# Patient Record
Sex: Female | Born: 1995 | Race: White | Hispanic: No | Smoking: Never smoker
Health system: Southern US, Community
[De-identification: ages and names within clinical notes are randomized; demographics above are authoritative.]

## PROBLEM LIST (undated history)

## (undated) ENCOUNTER — Inpatient Hospital Stay (HOSPITAL_COMMUNITY): Payer: Self-pay

## (undated) DIAGNOSIS — F32A Depression, unspecified: Secondary | ICD-10-CM

## (undated) DIAGNOSIS — N200 Calculus of kidney: Secondary | ICD-10-CM

## (undated) DIAGNOSIS — K219 Gastro-esophageal reflux disease without esophagitis: Secondary | ICD-10-CM

## (undated) DIAGNOSIS — F329 Major depressive disorder, single episode, unspecified: Secondary | ICD-10-CM

## (undated) DIAGNOSIS — F419 Anxiety disorder, unspecified: Secondary | ICD-10-CM

## (undated) DIAGNOSIS — Z87442 Personal history of urinary calculi: Secondary | ICD-10-CM

## (undated) DIAGNOSIS — I10 Essential (primary) hypertension: Secondary | ICD-10-CM

## (undated) HISTORY — DX: Depression, unspecified: F32.A

## (undated) HISTORY — DX: Major depressive disorder, single episode, unspecified: F32.9

---
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
## 2014-03-07 ENCOUNTER — Encounter (HOSPITAL_COMMUNITY): Payer: Self-pay | Admitting: Urology

## 2015-02-26 ENCOUNTER — Emergency Department (HOSPITAL_COMMUNITY)
Admission: EM | Admit: 2015-02-26 | Discharge: 2015-02-26 | Disposition: A | Discharge status: Home / Self Care | Payer: Self-pay | Attending: Emergency Medicine | Admitting: Emergency Medicine

## 2015-02-26 ENCOUNTER — Encounter (HOSPITAL_COMMUNITY): Payer: Self-pay | Admitting: Emergency Medicine

## 2015-02-26 DIAGNOSIS — Z79899 Other long term (current) drug therapy: Secondary | ICD-10-CM | POA: Insufficient documentation

## 2015-02-26 DIAGNOSIS — I10 Essential (primary) hypertension: Secondary | ICD-10-CM | POA: Insufficient documentation

## 2015-02-26 DIAGNOSIS — H578 Other specified disorders of eye and adnexa: Secondary | ICD-10-CM | POA: Insufficient documentation

## 2015-02-26 DIAGNOSIS — G43909 Migraine, unspecified, not intractable, without status migrainosus: Secondary | ICD-10-CM | POA: Insufficient documentation

## 2015-02-26 DIAGNOSIS — J309 Allergic rhinitis, unspecified: Secondary | ICD-10-CM | POA: Insufficient documentation

## 2015-02-26 DIAGNOSIS — F329 Major depressive disorder, single episode, unspecified: Secondary | ICD-10-CM | POA: Insufficient documentation

## 2015-02-26 DIAGNOSIS — Z79818 Long term (current) use of other agents affecting estrogen receptors and estrogen levels: Secondary | ICD-10-CM | POA: Insufficient documentation

## 2015-02-26 DIAGNOSIS — R1110 Vomiting, unspecified: Secondary | ICD-10-CM | POA: Insufficient documentation

## 2015-02-26 DIAGNOSIS — R197 Diarrhea, unspecified: Secondary | ICD-10-CM | POA: Insufficient documentation

## 2015-02-26 DIAGNOSIS — Z7951 Long term (current) use of inhaled steroids: Secondary | ICD-10-CM | POA: Insufficient documentation

## 2015-02-26 DIAGNOSIS — J069 Acute upper respiratory infection, unspecified: Secondary | ICD-10-CM | POA: Insufficient documentation

## 2015-02-26 LAB — RAPID STREP SCREEN (MED CTR MEBANE ONLY): Streptococcus, Group A Screen (Direct): NEGATIVE

## 2015-02-26 MED ORDER — NAPROXEN 250 MG PO TABS: 250.0000 mg | ORAL_TABLET | Freq: Two times a day (BID) | ORAL | Status: DC

## 2015-02-26 MED ORDER — CETIRIZINE HCL 10 MG PO TABS: 10.0000 mg | ORAL_TABLET | Freq: Every day | ORAL | Status: DC

## 2015-02-26 MED ORDER — FLUTICASONE PROPIONATE 50 MCG/ACT NA SUSP: 2.0000 | Freq: Every day | NASAL | Status: DC

## 2015-02-26 NOTE — Discharge Instructions (Signed)
Allergic Rhinitis °Allergic rhinitis is when the mucous membranes in the nose respond to allergens. Allergens are particles in the air that cause your body to have an allergic reaction. This causes you to release allergic antibodies. Through a chain of events, these eventually cause you to release histamine into the blood stream. Although meant to protect the body, it is this release of histamine that causes your discomfort, such as frequent sneezing, congestion, and an itchy, runny nose.  °CAUSES °Seasonal allergic rhinitis (hay fever) is caused by pollen allergens that may come from grasses, trees, and weeds. Year-round allergic rhinitis (perennial allergic rhinitis) is caused by allergens such as house dust mites, pet dander, and mold spores. °SYMPTOMS °· Nasal stuffiness (congestion). °· Itchy, runny nose with sneezing and tearing of the eyes. °DIAGNOSIS °Your health care provider can help you determine the allergen or allergens that trigger your symptoms. If you and your health care provider are unable to determine the allergen, skin or blood testing may be used. Your health care provider will diagnose your condition after taking your health history and performing a physical exam. Your health care provider may assess you for other related conditions, such as asthma, pink eye, or an ear infection. °TREATMENT °Allergic rhinitis does not have a cure, but it can be controlled by: °· Medicines that block allergy symptoms. These may include allergy shots, nasal sprays, and oral antihistamines. °· Avoiding the allergen. °Hay fever may often be treated with antihistamines in pill or nasal spray forms. Antihistamines block the effects of histamine. There are over-the-counter medicines that may help with nasal congestion and swelling around the eyes. Check with your health care provider before taking or giving this medicine. °If avoiding the allergen or the medicine prescribed do not work, there are many new medicines  your health care provider can prescribe. Stronger medicine may be used if initial measures are ineffective. Desensitizing injections can be used if medicine and avoidance does not work. Desensitization is when a patient is given ongoing shots until the body becomes less sensitive to the allergen. Make sure you follow up with your health care provider if problems continue. °HOME CARE INSTRUCTIONS °It is not possible to completely avoid allergens, but you can reduce your symptoms by taking steps to limit your exposure to them. It helps to know exactly what you are allergic to so that you can avoid your specific triggers. °SEEK MEDICAL CARE IF: °· You have a fever. °· You develop a cough that does not stop easily (persistent). °· You have shortness of breath. °· You start wheezing. °· Symptoms interfere with normal daily activities. °  °This information is not intended to replace advice given to you by your health care provider. Make sure you discuss any questions you have with your health care provider. °  °Document Released: 11/03/2000 Document Revised: 03/01/2014 Document Reviewed: 10/16/2012 °Elsevier Interactive Patient Education ©2016 [employer] °Upper Respiratory Infection, Adult °Most upper respiratory infections (URIs) are a viral infection of the air passages leading to the lungs. A URI affects the nose, throat, and upper air passages. The most common type of URI is nasopharyngitis and is typically referred to as "the common cold." °URIs run their course and usually go away on their own. Most of the time, a URI does not require medical attention, but sometimes a bacterial infection in the upper airways can follow a viral infection. This is called a secondary infection. Sinus and middle ear infections are common types of secondary upper respiratory infections. °  Bacterial pneumonia can also complicate a URI. A URI can worsen asthma and chronic obstructive pulmonary disease (COPD). Sometimes, these  complications can require emergency medical care and may be life threatening.  °CAUSES °Almost all URIs are caused by viruses. A virus is a type of germ and can spread from one person to another.  °RISKS FACTORS °You may be at risk for a URI if:  °· You smoke.   °· You have chronic heart or lung disease. °· You have a weakened defense (immune) system.   °· You are very young or very old.   °· You have nasal allergies or asthma. °· You work in crowded or poorly ventilated areas. °· You work in health care facilities or schools. °SIGNS AND SYMPTOMS  °Symptoms typically develop 2-3 days after you come in contact with a cold virus. Most viral URIs last 7-10 days. However, viral URIs from the influenza virus (flu virus) can last 14-18 days and are typically more severe. Symptoms may include:  °· Runny or stuffy (congested) nose.   °· Sneezing.   °· Cough.   °· Sore throat.   °· Headache.   °· Fatigue.   °· Fever.   °· Loss of appetite.   °· Pain in your forehead, behind your eyes, and over your cheekbones (sinus pain). °· Muscle aches.   °DIAGNOSIS  °Your health care provider may diagnose a URI by: °· Physical exam. °· Tests to check that your symptoms are not due to another condition such as: °¨ Strep throat. °¨ Sinusitis. °¨ Pneumonia. °¨ Asthma. °TREATMENT  °A URI goes away on its own with time. It cannot be cured with medicines, but medicines may be prescribed or recommended to relieve symptoms. Medicines may help: °· Reduce your fever. °· Reduce your cough. °· Relieve nasal congestion. °HOME CARE INSTRUCTIONS  °· Take medicines only as directed by your health care provider.   °· Gargle warm saltwater or take cough drops to comfort your throat as directed by your health care provider. °· Use a warm mist humidifier or inhale steam from a shower to increase air moisture. This may make it easier to breathe. °· Drink enough fluid to keep your urine clear or pale yellow.   °· Eat soups and other clear broths and maintain  good nutrition.   °· Rest as needed.   °· Return to work when your temperature has returned to normal or as your health care provider advises. You may need to stay home longer to avoid infecting others. You can also use a face mask and careful hand washing to prevent spread of the virus. °· Increase the usage of your inhaler if you have asthma.   °· Do not use any tobacco products, including cigarettes, chewing tobacco, or electronic cigarettes. If you need help quitting, ask your health care provider. °PREVENTION  °The best way to protect yourself from getting a cold is to practice good hygiene.  °· Avoid oral or hand contact with people with cold symptoms.   °· Wash your hands often if contact occurs.   °There is no clear evidence that vitamin C, vitamin E, echinacea, or exercise reduces the chance of developing a cold. However, it is always recommended to get plenty of rest, exercise, and practice good nutrition.  °SEEK MEDICAL CARE IF:  °· You are getting worse rather than better.   °· Your symptoms are not controlled by medicine.   °· You have chills. °· You have worsening shortness of breath. °· You have brown or red mucus. °· You have yellow or brown nasal discharge. °· You have pain in your   face, especially when you bend forward. °· You have a fever. °· You have swollen neck glands. °· You have pain while swallowing. °· You have white areas in the back of your throat. °SEEK IMMEDIATE MEDICAL CARE IF:  °· You have severe or persistent: °¨ Headache. °¨ Ear pain. °¨ Sinus pain. °¨ Chest pain. °· You have chronic lung disease and any of the following: °¨ Wheezing. °¨ Prolonged cough. °¨ Coughing up blood. °¨ A change in your usual mucus. °· You have a stiff neck. °· You have changes in your: °¨ Vision. °¨ Hearing. °¨ Thinking. °¨ Mood. °MAKE SURE YOU:  °· Understand these instructions. °· Will watch your condition. °· Will get help right away if you are not doing well or get worse. °  °This information is not  intended to replace advice given to you by your health care provider. Make sure you discuss any questions you have with your health care provider. °  °Document Released: 08/04/2000 Document Revised: 06/25/2014 Document Reviewed: 05/16/2013 °Elsevier Interactive Patient Education ©2016 Elsevier Inc. ° °

## 2015-02-26 NOTE — ED Provider Notes (Signed)
CSN: [id number]     Arrival date & time 02/26/15  1037 History  By signing my name below, I, Tanda RockersMargaux Venter, attest that this documentation has been prepared under the direction and in the presence of Everlene FarrierWilliam Tylin Stradley, PA-C. Electronically Signed: Tanda RockersMargaux Venter, ED Scribe. 02/26/2015. 11:53 AM.   Chief Complaint  Patient presents with  . Sore Throat  . Nasal Congestion   The history is provided by the patient. No language interpreter was used.     HPI Comments: Holly Jacobs is a 20 y.o. female who presents to the Emergency Department complaining of gradual onset, constant, sore throat x 3 days. Pt also complains of nasal congestion, post nasal drip, bilateral ear pressure, sneezing, myalgias, and a mild cough. She reports vomiting and diarrhea earlier in the week but none today Pt has been eating and drinking normally today. Denies wheezing, shortness of breath, lightheadedness, dizziness, syncope, nausea, fever, abdominal pain, or any other associated symptoms.   Past Medical History  Diagnosis Date  . Migraine   . Hypertension   . Depression    Past Surgical History  Procedure Laterality Date  . Cystoscopy with retrograde pyelogram, ureteroscopy and stent placement Left 12/29/2012    Procedure: CYSTOSCOPY WITH LEFT RETROGRADE PYELOGRAM, BALLOON DILATION LEFT URETER; LEFT URETEROSCOPY ;  Surgeon: Ky BarbanMohammad I Javaid, MD;  Location: [facility];  Service: Urology;  Laterality: Left;  . Cystoscopy with stent placement Right 12/29/2012    Procedure: CYSTOSCOPY WITH STENT PLACEMENT RIGHT URETER;  Surgeon: Ky BarbanMohammad I Javaid, MD;  Location: [facility];  Service: Urology;  Laterality: Right;  . Cystoscopy w/ ureteral stent removal Right 12/30/2012    Procedure: CYSTOSCOPY WITH STENT REMOVAL;  Surgeon: Ky BarbanMohammad I Javaid, MD;  Location: [facility];  Service: Urology;  Laterality: Right;   Family History  Problem Relation Age of Onset  . Migraines Father   . Skin cancer Maternal Grandmother   . Diabetes  Maternal Grandmother   . High Cholesterol Maternal Grandmother    Social History  Substance Use Topics  . Smoking status: Never Smoker   . Smokeless tobacco: Never Used  . Alcohol Use: No   OB History    No data available     Review of Systems  Constitutional: Negative for fever and appetite change.  HENT: Positive for congestion, ear pain, postnasal drip, rhinorrhea, sneezing and sore throat. Negative for ear discharge, facial swelling, mouth sores, nosebleeds, sinus pressure and trouble swallowing.   Eyes: Positive for itching. Negative for pain, discharge and redness.  Respiratory: Positive for cough. Negative for shortness of breath and wheezing.   Gastrointestinal: Negative for nausea, vomiting, abdominal pain and diarrhea.  Genitourinary: Negative for dysuria.  Musculoskeletal: Positive for myalgias.  Skin: Negative for rash.  Neurological: Negative for dizziness, syncope and light-headedness.      Allergies  Imitrex; Sulfa antibiotics; and Tramadol  Home Medications   Prior to Admission medications   Medication Sig Start Date End Date Taking? Authorizing Provider  atomoxetine (STRATTERA) 18 MG capsule Take 18 mg by mouth daily.    Historical Provider, MD  cetirizine (ZYRTEC ALLERGY) 10 MG tablet Take 1 tablet (10 mg total) by mouth daily. 02/26/15   Everlene FarrierWilliam Karis Emig, PA-C  cyclobenzaprine (FLEXERIL) 10 MG tablet Take 10 mg by mouth at bedtime.    Historical Provider, MD  fluticasone (FLONASE) 50 MCG/ACT nasal spray Place 2 sprays into both nostrils daily. 02/26/15   Everlene FarrierWilliam Aldwin Micalizzi, PA-C  levonorgestrel-ethinyl estradiol (AVIANE,ALESSE,LESSINA) 0.1-20 MG-MCG tablet Take 1 tablet by  mouth daily.    Historical Provider, MD  naproxen (NAPROSYN) 250 MG tablet Take 1 tablet (250 mg total) by mouth 2 (two) times daily with a meal. 02/26/15   Everlene Farrier, PA-C  oxyCODONE-acetaminophen (PERCOCET) 7.5-325 MG per tablet Take 1 tablet by mouth every 6 (six) hours as needed for pain.  12/29/12   Robyne Askew, MD  topiramate (TOPAMAX) 25 MG capsule Take 50 mg by mouth every evening.     Historical Provider, MD   Triage Vitals: BP 140/77 mmHg  Pulse 91  Temp(Src) 97.9 F (36.6 C) (Oral)  Resp 16  Ht 5\' 5"  (1.651 m)  Wt 151 lb (68.493 kg)  BMI 25.13 kg/m2  SpO2 100%   Physical Exam  Constitutional: She appears well-developed and well-nourished. No distress.  Nontoxic appearing.  HENT:  Head: Normocephalic and atraumatic.  Right Ear: External ear normal.  Left Ear: External ear normal.  Mouth/Throat: No oropharyngeal exudate.  Mild middle ear effusion bilaterally No TM erythema or loss of landmarks Bogy nasal turbinates bilaterally Mild bilateral tonsillar hypertrophy without exudates Uvula is midline without edema  No peritonsillar abscess No trismus  Eyes: Conjunctivae are normal. Pupils are equal, round, and reactive to light. Right eye exhibits no discharge. Left eye exhibits no discharge.  Neck: Normal range of motion. Neck supple. No JVD present. No tracheal deviation present.  Cardiovascular: Normal rate, regular rhythm, normal heart sounds and intact distal pulses.   Pulmonary/Chest: Effort normal and breath sounds normal. No respiratory distress. She has no wheezes. She has no rhonchi. She has no rales.  Lungs are clear to auscultation bilaterally.  Abdominal: Soft. There is no tenderness. There is no guarding.  Lymphadenopathy:    She has no cervical adenopathy.  Neurological: She is alert. Coordination normal.  Skin: Skin is warm and dry. No rash noted. She is not diaphoretic. No erythema. No pallor.  Psychiatric: She has a normal mood and affect. Her behavior is normal.  Nursing note and vitals reviewed.   ED Course  Procedures (including critical care time)  DIAGNOSTIC STUDIES: Oxygen Saturation is 100% on RA, normal by my interpretation.    COORDINATION OF CARE: 11:51 AM-Discussed treatment plan which includes nasal steroids with pt at  bedside and pt agreed to plan.   Labs Review Labs Reviewed  RAPID STREP SCREEN (NOT AT [facility])  CULTURE, GROUP A STREP    Imaging Review No results found. I have personally reviewed and evaluated these lab results as part of my medical decision-making.   EKG Interpretation None      Filed Vitals:   02/26/15 1049  BP: 140/77  Pulse: 91  Temp: 97.9 F (36.6 C)  TempSrc: Oral  Resp: 16  Height: 5\' 5"  (1.651 m)  Weight: 68.493 kg  SpO2: 100%     MDM   Meds given in ED:  Medications - No data to display  Discharge Medication List as of 02/26/2015 11:54 AM    START taking these medications   Details  cetirizine (ZYRTEC ALLERGY) 10 MG tablet Take 1 tablet (10 mg total) by mouth daily., Starting 02/26/2015, Until Discontinued, Print    fluticasone (FLONASE) 50 MCG/ACT nasal spray Place 2 sprays into both nostrils daily., Starting 02/26/2015, Until Discontinued, Print    naproxen (NAPROSYN) 250 MG tablet Take 1 tablet (250 mg total) by mouth 2 (two) times daily with a meal., Starting 02/26/2015, Until Discontinued, Print        Final diagnoses:  URI (upper respiratory infection)  Allergic  rhinitis, unspecified allergic rhinitis type   This s a 20 y.o. female who presents to the Emergency Department complaining of gradual onset, constant, sore throat x 3 days. Pt also complains of nasal congestion, post nasal drip, bilateral ear pressure, sneezing, myalgias, and a mild cough. She reports she has been eating and drinking normally. On exam the patient is afebrile nontoxic appearing. Patient has mild bilateral tonsillar hypertrophy without exudates. Uvula is midline. No peritonsillar abscess. No trismus. Mild middle ear effusion. No evidence of TM erythema or infection. Lungs are clear to auscultation bilaterally. Rapid strep is negative. Patient has symptoms of allergic rhinitis and upper respiratory infection. We will discharge patient with persistent for Zyrtec, Flonase and naproxen.  I encouraged her to push fluids. Encourage close follow-up by her primary care provider. I advised the patient to follow-up with their primary care provider this week. I advised the patient to return to the emergency department with new or worsening symptoms or new concerns. The patient verbalized understanding and agreement with plan.     I personally performed the services described in this documentation, which was scribed in my presence. The recorded information has been reviewed and is accurate.        Everlene Farrier, PA-C 02/26/15 1206  Leta Baptist, MD 03/01/15 [phone]

## 2015-02-26 NOTE — ED Notes (Signed)
Patient states sore throat since Monday.   Patient also complains of nasal congestion, but no chest congestion.   States having some generalized body aches.   Patient has been taking advil and dayquil/nyquil at home.

## 2015-02-28 LAB — CULTURE, GROUP A STREP: STREP A CULTURE: NEGATIVE

## 2018-09-25 ENCOUNTER — Inpatient Hospital Stay (HOSPITAL_COMMUNITY): Payer: Medicaid Other

## 2018-09-25 ENCOUNTER — Other Ambulatory Visit: Payer: Self-pay

## 2018-09-25 ENCOUNTER — Inpatient Hospital Stay (HOSPITAL_COMMUNITY)
Admission: EM | Admit: 2018-09-25 | Discharge: 2018-09-25 | Disposition: A | Discharge status: Home / Self Care | Payer: Medicaid Other | Attending: Obstetrics and Gynecology | Admitting: Obstetrics and Gynecology

## 2018-09-25 ENCOUNTER — Encounter (HOSPITAL_COMMUNITY): Payer: Self-pay | Admitting: Student

## 2018-09-25 DIAGNOSIS — Z3A01 Less than 8 weeks gestation of pregnancy: Secondary | ICD-10-CM | POA: Insufficient documentation

## 2018-09-25 DIAGNOSIS — O200 Threatened abortion: Secondary | ICD-10-CM

## 2018-09-25 DIAGNOSIS — R1031 Right lower quadrant pain: Secondary | ICD-10-CM | POA: Diagnosis present

## 2018-09-25 DIAGNOSIS — O26899 Other specified pregnancy related conditions, unspecified trimester: Secondary | ICD-10-CM

## 2018-09-25 LAB — COMPREHENSIVE METABOLIC PANEL
ALT: 19 U/L (ref 0–44)
AST: 21 U/L (ref 15–41)
Albumin: 4.1 g/dL (ref 3.5–5.0)
Alkaline Phosphatase: 61 U/L (ref 38–126)
Anion gap: 9 (ref 5–15)
BUN: 9 mg/dL (ref 6–20)
CO2: 23 mmol/L (ref 22–32)
Calcium: 9.4 mg/dL (ref 8.9–10.3)
Chloride: 106 mmol/L (ref 98–111)
Creatinine, Ser: 0.71 mg/dL (ref 0.44–1.00)
GFR calc Af Amer: >60 mL/min (ref >60)
GFR calc non Af Amer: >60 mL/min (ref >60)
Glucose, Bld: 92 mg/dL (ref 70–99)
Potassium: 3.5 mmol/L (ref 3.5–5.1)
Sodium: 138 mmol/L (ref 135–145)
Total Bilirubin: 0.6 mg/dL (ref 0.3–1.2)
Total Protein: 6.8 g/dL (ref 6.5–8.1)

## 2018-09-25 LAB — CBC WITH DIFFERENTIAL/PLATELET
Abs Immature Granulocytes: 0.02 10*3/uL (ref 0.00–0.07)
Basophils Absolute: 0 10*3/uL (ref 0.0–0.1)
Basophils Relative: 1 %
Eosinophils Absolute: 0 10*3/uL (ref 0.0–0.5)
Eosinophils Relative: 0 %
HCT: 39 % (ref 36.0–46.0)
Hemoglobin: 13.3 g/dL (ref 12.0–15.0)
Immature Granulocytes: 0 %
Lymphocytes Relative: 27 %
Lymphs Abs: 2 10*3/uL (ref 0.7–4.0)
MCH: 31.7 pg (ref 26.0–34.0)
MCHC: 34.1 g/dL (ref 30.0–36.0)
MCV: 93.1 fL (ref 80.0–100.0)
Monocytes Absolute: 0.9 10*3/uL (ref 0.1–1.0)
Monocytes Relative: 13 %
Neutro Abs: 4.4 10*3/uL (ref 1.7–7.7)
Neutrophils Relative %: 59 %
Platelets: 291 10*3/uL (ref 150–400)
RBC: 4.19 MIL/uL (ref 3.87–5.11)
RDW: 11.9 % (ref 11.5–15.5)
WBC: 7.4 10*3/uL (ref 4.0–10.5)
nRBC: 0 % (ref 0.0–0.2)

## 2018-09-25 LAB — WET PREP, GENITAL
Clue Cells Wet Prep HPF POC: NONE SEEN
Sperm: NONE SEEN
Trich, Wet Prep: NONE SEEN
Yeast Wet Prep HPF POC: NONE SEEN

## 2018-09-25 LAB — POCT PREGNANCY, URINE: Preg Test, Ur: POSITIVE — AB

## 2018-09-25 LAB — HCG, QUANTITATIVE, PREGNANCY: hCG, Beta Chain, Quant, S: 2691 m[IU]/mL — ABNORMAL HIGH (ref <5)

## 2018-09-25 NOTE — ED Triage Notes (Signed)
Called report and transportation.

## 2018-09-25 NOTE — MAU Note (Signed)
Been having some abd pain in her stomach this past wk.  Kind of felt like it did when she had ovarian cyst.  Realized she was late on her period, HPT was positive. Denies bleeding or d/c.

## 2018-09-25 NOTE — Discharge Instructions (Signed)

## 2018-09-25 NOTE — MAU Provider Note (Signed)
Chief Complaint: Abdominal Pain and Possible Pregnancy   First Provider Initiated Contact with Patient 09/25/18 1327     SUBJECTIVE HPI: Holly Jacobs is a 23 y.o. G1P0 at [redacted]w[redacted]d who presents to MAU reporting RLQ abdominal pain since last week; "like when I had an ovarian cyst." She realized her period was late and took a (+) HPT on 09/23/18. She denies VB or abnormal vaginal d/c.  Location: RLQ Quality: dull Severity: 3-4/10 on pain scale Duration: 1 week Timing: intermittent Modifying factors: nothing Associated signs and symptoms: amenorrhea  Past Medical History:  Diagnosis Date  . Depression   . Hypertension   . Migraine    OB History  Gravida Para Term Preterm AB Living  1            SAB TAB Ectopic Multiple Live Births               # Outcome Date GA Lbr Len/2nd Weight Sex Delivery Anes PTL Lv  1 Current            Past Surgical History:  Procedure Laterality Date  . CYSTOSCOPY W/ URETERAL STENT REMOVAL Right 12/30/2012   Procedure: CYSTOSCOPY WITH STENT REMOVAL;  Surgeon: Marissa Nestle, MD;  Location: [facility];  Service: Urology;  Laterality: Right;  . CYSTOSCOPY WITH RETROGRADE PYELOGRAM, URETEROSCOPY AND STENT PLACEMENT Left 12/29/2012   Procedure: CYSTOSCOPY WITH LEFT RETROGRADE PYELOGRAM, BALLOON DILATION LEFT URETER; LEFT URETEROSCOPY ;  Surgeon: Marissa Nestle, MD;  Location: [facility];  Service: Urology;  Laterality: Left;  . CYSTOSCOPY WITH STENT PLACEMENT Right 12/29/2012   Procedure: CYSTOSCOPY WITH STENT PLACEMENT RIGHT URETER;  Surgeon: Marissa Nestle, MD;  Location: [facility];  Service: Urology;  Laterality: Right;   Social History   Socioeconomic History  . Marital status: Single    Spouse name: Not on file  . Number of children: 0  . Years of education: HS  . Highest education level: Not on file  Occupational History    Employer: [employer]     Comment: [employer]    Employer: OTHER    Comment: Student  Social Needs  . Financial  resource strain: Not on file  . Food insecurity    Worry: Not on file    Inability: Not on file  . Transportation needs    Medical: Not on file    Non-medical: Not on file  Tobacco Use  . Smoking status: Never Smoker  . Smokeless tobacco: Never Used  Substance and Sexual Activity  . Alcohol use: No  . Drug use: No  . Sexual activity: Yes    Birth control/protection: Pill  Lifestyle  . Physical activity    Days per week: Not on file    Minutes per session: Not on file  . Stress: Not on file  Relationships  . Social Herbalist on phone: Not on file    Gets together: Not on file    Attends religious service: Not on file    Active member of club or organization: Not on file    Attends meetings of clubs or organizations: Not on file    Relationship status: Not on file  . Intimate partner violence    Fear of current or ex partner: Not on file    Emotionally abused: Not on file    Physically abused: Not on file    Forced sexual activity: Not on file  Other Topics Concern  . Not on  file  Social History Narrative   Patient lives at home with mom, grandma, step-grandpa.   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    Caffeine Use: 1 soda daily occasionally   Family History  Problem Relation Age of Onset  . Migraines Father   . Skin cancer Maternal Grandmother   . Diabetes Maternal Grandmother   . High Cholesterol Maternal Grandmother    No current facility-administered medications on file prior to encounter.    Current Outpatient Medications on File Prior to Encounter  Medication Sig Dispense Refill  . atomoxetine (STRATTERA) 18 MG capsule Take 18 mg by mouth daily.    . cetirizine (ZYRTEC ALLERGY) 10 MG tablet Take 1 tablet (10 mg total) by mouth daily. 30 tablet 1  . cyclobenzaprine (FLEXERIL) 10 MG tablet Take 10 mg by mouth at bedtime.    . fluticasone (FLONASE) 50 MCG/ACT nasal spray Place 2 sprays into both nostrils daily. 16 g 0  .  levonorgestrel-ethinyl estradiol (AVIANE,ALESSE,LESSINA) 0.1-20 MG-MCG tablet Take 1 tablet by mouth daily.    . naproxen (NAPROSYN) 250 MG tablet Take 1 tablet (250 mg total) by mouth 2 (two) times daily with a meal. 30 tablet 0  . oxyCODONE-acetaminophen (PERCOCET) 7.5-325 MG per tablet Take 1 tablet by mouth every 6 (six) hours as needed for pain. 30 tablet 0  . topiramate (TOPAMAX) 25 MG capsule Take 50 mg by mouth every evening.      Allergies  Allergen Reactions  . Imitrex [Sumatriptan Base] Anaphylaxis, Shortness Of Breath and Itching  . Sulfa Antibiotics Anaphylaxis  . Tramadol Hives    I have reviewed patient's Past Medical Hx, Surgical Hx, Family Hx, Social Hx, medications and allergies.   Review of Systems  Constitutional: Negative.   HENT: Negative.   Eyes: Negative.   Respiratory: Negative.   Cardiovascular: Negative.   Gastrointestinal: Negative.   Endocrine: Negative.   Genitourinary: Positive for pelvic pain (RT side pain, "like when I had an ovarian cyst last year").       Missed period, (+) HPT on 09/23/18  Musculoskeletal: Negative.   Skin: Negative.   Allergic/Immunologic: Negative.   Neurological: Negative.   Hematological: Negative.   Psychiatric/Behavioral: Negative.     OBJECTIVE Patient Vitals for the past 24 hrs:  BP Temp Temp src Pulse Resp SpO2 Height Weight  09/25/18 1135 129/79 98.5 F (36.9 C) Oral 71 18 100 % 5\' 5"  (1.651 m) 76.4 kg   Constitutional: Well-developed, well-nourished female in no acute distress.  Cardiovascular: normal rate & rhythm, no murmur Respiratory: normal rate and effort. Lung sounds clear throughout GI: Abd soft, non-tender, Pos BS x 4. No guarding or rebound tenderness MS: Extremities nontender, no edema, normal ROM Neurologic: Alert and oriented x 4.  GU:     SPECULUM EXAM: NEFG, physiologic discharge, no blood noted, cervix clean  BIMANUAL: No CMT. cervix pink, no lesions; uterus normal size, mild RT adnexal  tenderness, no masses.    LAB RESULTS Results for orders placed or performed during the hospital encounter of 09/25/18 (from the past 24 hour(s))  Pregnancy, urine POC     Status: Abnormal   Collection Time: 09/25/18 11:05 AM  Result Value Ref Range   Preg Test, Ur POSITIVE (A) NEGATIVE  CBC with Differential/Platelet     Status: None   Collection Time: 09/25/18 11:46 AM  Result Value Ref Range   WBC 7.4 4.0 - 10.5 K/uL   RBC 4.19 3.87 - 5.11 MIL/uL  Hemoglobin 13.3 12.0 - 15.0 g/dL   HCT 16.139.0 09.636.0 - 04.546.0 %   MCV 93.1 80.0 - 100.0 fL   MCH 31.7 26.0 - 34.0 pg   MCHC 34.1 30.0 - 36.0 g/dL   RDW 40.911.9 81.111.5 - 91.415.5 %   Platelets 291 150 - 400 K/uL   nRBC 0.0 0.0 - 0.2 %   Neutrophils Relative % 59 %   Neutro Abs 4.4 1.7 - 7.7 K/uL   Lymphocytes Relative 27 %   Lymphs Abs 2.0 0.7 - 4.0 K/uL   Monocytes Relative 13 %   Monocytes Absolute 0.9 0.1 - 1.0 K/uL   Eosinophils Relative 0 %   Eosinophils Absolute 0.0 0.0 - 0.5 K/uL   Basophils Relative 1 %   Basophils Absolute 0.0 0.0 - 0.1 K/uL   Immature Granulocytes 0 %   Abs Immature Granulocytes 0.02 0.00 - 0.07 K/uL  Comprehensive metabolic panel     Status: None   Collection Time: 09/25/18 11:46 AM  Result Value Ref Range   Sodium 138 135 - 145 mmol/L   Potassium 3.5 3.5 - 5.1 mmol/L   Chloride 106 98 - 111 mmol/L   CO2 23 22 - 32 mmol/L   Glucose, Bld 92 70 - 99 mg/dL   BUN 9 6 - 20 mg/dL   Creatinine, Ser 7.820.71 0.44 - 1.00 mg/dL   Calcium 9.4 8.9 - 95.610.3 mg/dL   Total Protein 6.8 6.5 - 8.1 g/dL   Albumin 4.1 3.5 - 5.0 g/dL   AST 21 15 - 41 U/L   ALT 19 0 - 44 U/L   Alkaline Phosphatase 61 38 - 126 U/L   Total Bilirubin 0.6 0.3 - 1.2 mg/dL   GFR calc non Af Amer >60 >60 mL/min   GFR calc Af Amer >60 >60 mL/min   Anion gap 9 5 - 15  hCG, quantitative, pregnancy     Status: Abnormal   Collection Time: 09/25/18 11:46 AM  Result Value Ref Range   hCG, Beta Chain, Quant, S 2,691 (H) <5 mIU/mL  ABO/Rh     Status: None    Collection Time: 09/25/18 11:48 AM  Result Value Ref Range   ABO/RH(D)      A NEG Performed at [facility], [address] [place], [place] [address]   Wet prep, genital     Status: Abnormal   Collection Time: 09/25/18  1:36 PM  Result Value Ref Range   Yeast Wet Prep HPF POC NONE SEEN NONE SEEN   Trich, Wet Prep NONE SEEN NONE SEEN   Clue Cells Wet Prep HPF POC NONE SEEN NONE SEEN   WBC, Wet Prep HPF POC FEW (A) NONE SEEN   Sperm NONE SEEN     IMAGING [place] Ob Less Than 14 Weeks With Ob Transvaginal  Result Date: 09/25/2018 CLINICAL DATA:  Pelvic pain EXAM: OBSTETRIC <14 WK US AND TRANSVAGINAL OB US TECHNIQUE: Both transabdominal and transvaginal ultrasound examinations were performed for complete evaluation of the gestation as well as the maternal uterus, adnexal regions, and pelvic cul-de-sac. Transvaginal technique was performed to assess early pregnancy. COMPARISON:  None. FINDINGS: Intrauterine gestational sac: Visualized Yolk sac:  Not visualized Embryo:  Not visualized Cardiac Activity: Not visualized MSD: 3 mm   5 w   0 d Subchorionic hemorrhage:  None visualized. Maternal uterus/adnexae: Cervical os closed. Right ovary measures 2.1 x 1.4 x 1.0 cm. Left ovary measures 3.9 x 2.5 x 2.8 cm. There is an approximately 2  cm corpus luteum on the right. No free pelvic fluid. IMPRESSION: Probable early intrauterine gestational sac, but no yolk sac, fetal pole, or cardiac activity yet visualized. Recommend follow-up quantitative B-HCG levels and follow-up US in 14 days to assess viability. This recommendation follows SRU consensus guidelines: Diagnostic Criteria for Nonviable Pregnancy Early in the First Trimester. Malva Limes Engl J Med 2013; 161:0960-45; 369:1443-51. based on gestational sac size, estimated gestational age is approximately 5 weeks. Study otherwise unremarkable. Electronically Signed   By: Bretta BangWilliam  Woodruff III M.D.   On: 09/25/2018 14:41    MAU COURSE Orders Placed This Encounter  Procedures   . Wet prep, genital  . US OB LESS THAN 14 WEEKS WITH OB TRANSVAGINAL  . Urinalysis, Routine w reflex microscopic  . CBC with Differential/Platelet  . Comprehensive metabolic panel  . hCG, quantitative, pregnancy  . Pregnancy, urine POC  . ABO/Rh  . Discharge patient    MDM  ASSESSMENT 1. Threatened miscarriage in early pregnancy   2. Abdominal pain in pregnancy     PLAN Discharge home in stable condition. Information provided on abd pain in pg and threatened miscarriage Bleeding and miscarriage precautions reviewed Return to MAU:   If you have heavier bleeding that soaks through more that 2 pads per hour for an hour or more  If you bleed so much that you feel like you might pass out or you do pass out  If you have significant abdominal pain that is not improved with Tylenol   If you develop a fever > 100.5  Follow-up Information    [facility] Go on 09/27/2018.   Specialty: Obstetrics and Gynecology Why: At 1:30 PM for repeat blood work Contact information: [address], [id number] [place] [phone]         Allergies as of 09/25/2018      Reactions   Imitrex [sumatriptan Base] Anaphylaxis, Shortness Of Breath, Itching   Sulfa Antibiotics Anaphylaxis   Tramadol Hives      Medication List    STOP taking these medications   atomoxetine 18 MG capsule Commonly known as: STRATTERA   levonorgestrel-ethinyl estradiol 0.1-20 MG-MCG tablet Commonly known as: ALESSE   naproxen 250 MG tablet Commonly known as: Naprosyn   oxyCODONE-acetaminophen 7.5-325 MG tablet Commonly known as: Percocet   topiramate 25 MG capsule Commonly known as: TOPAMAX     TAKE these medications   cetirizine 10 MG tablet Commonly known as: ZyrTEC Allergy Take 1 tablet (10 mg total) by mouth daily.   cyclobenzaprine 10 MG tablet Commonly known as: FLEXERIL Take 10 mg by mouth at  bedtime.   fluticasone 50 MCG/ACT nasal spray Commonly known as: FLONASE Place 2 sprays into both nostrils daily.       Raelyn MoraDawson, Archer Moist, CNM 09/25/2018 1:27 PM

## 2018-09-26 LAB — ABO/RH
ABO/RH(D): A NEG
Antibody Screen: NEGATIVE

## 2018-09-26 LAB — GC/CHLAMYDIA PROBE AMP (~~LOC~~) NOT AT ARMC
Chlamydia: NEGATIVE
Neisseria Gonorrhea: NEGATIVE

## 2018-09-27 ENCOUNTER — Encounter: Payer: Self-pay | Admitting: Obstetrics and Gynecology

## 2018-09-27 ENCOUNTER — Other Ambulatory Visit: Payer: Self-pay

## 2018-09-27 ENCOUNTER — Telehealth: Payer: Self-pay | Admitting: General Practice

## 2018-09-27 ENCOUNTER — Ambulatory Visit (INDEPENDENT_AMBULATORY_CARE_PROVIDER_SITE_OTHER): Payer: Self-pay | Admitting: General Practice

## 2018-09-27 DIAGNOSIS — Z6791 Unspecified blood type, Rh negative: Secondary | ICD-10-CM | POA: Insufficient documentation

## 2018-09-27 DIAGNOSIS — O3680X0 Pregnancy with inconclusive fetal viability, not applicable or unspecified: Secondary | ICD-10-CM

## 2018-09-27 LAB — BETA HCG QUANT (REF LAB): hCG Quant: 4881 m[IU]/mL

## 2018-09-27 NOTE — Progress Notes (Signed)
Patient presents to office today for stat bhcg following recent MAU visit on 8/3. Patient reports improvement in pain since then and thinks what she maybe had then was gas- denies bleeding. Discussed with patient we are monitoring your bhcg levels today, results/history will be reviewed withd a doctor in the office and we will call her with results/updated plan of care. Patient verbalized understanding and had no questions at this time.  Reviewed results with Dr Hulan Fray who finds appropriate rise in bhcg levels- patient should have follow up ultrasound in 2 weeks. Scheduled 8/19 @ 845. Will call patient with results.  Koren Bound RN BSN 09/27/18

## 2018-09-27 NOTE — Telephone Encounter (Signed)
Called patient & informed her of bhcg results and ultrasound appt on 8/19 @ 845. Ectopic precautions reviewed with patient. Patient verbalized understanding & asked about bringing someone with her to her ultrasound appt. Told patient unfortunately visitors are still not allowed. Patient verbalized understanding & had no other questions.

## 2018-10-02 NOTE — Progress Notes (Signed)
I have reviewed this chart and agree with the RN/CMA assessment and management.    Tiberius Loftus C Keilen Kahl, MD, FACOG Attending Physician, Faculty Practice [facility]  

## 2018-10-06 ENCOUNTER — Telehealth: Payer: Self-pay

## 2018-10-06 MED ORDER — PROMETHAZINE HCL 25 MG PO TABS: 25.0000 mg | ORAL_TABLET | Freq: Four times a day (QID) | ORAL | 0 refills | Status: DC | PRN

## 2018-10-06 NOTE — Telephone Encounter (Signed)
Pt called and stated that she is having severe nausea could someone give her a call back.  Called pt and pt reports continuing to have nausea.  I e=porescribed per protocol Phenergan 25 mg po tablet and informed pt that if it makes her drowsy that she can try to take half the tablet.  I also advised pt that she can take Vit B6 100 mg po bid with Unisom 25 mg tablet at night.  Pt verbalized understanding.

## 2018-10-10 ENCOUNTER — Telehealth: Payer: Self-pay | Admitting: Family Medicine

## 2018-10-10 NOTE — Telephone Encounter (Signed)
Spoke to patient about her appointment on 8/19 @ 10:00. Patient instructed to wear a face mask for the entire appointment and no visitors are allowed with her during the visit. Patient screened for covid symptoms and denied having any

## 2018-10-11 ENCOUNTER — Other Ambulatory Visit: Payer: Self-pay

## 2018-10-11 ENCOUNTER — Encounter: Payer: Self-pay | Admitting: Medical

## 2018-10-11 ENCOUNTER — Ambulatory Visit (HOSPITAL_COMMUNITY)
Admission: RE | Admit: 2018-10-11 | Discharge: 2018-10-11 | Disposition: A | Discharge status: Home / Self Care | Payer: Medicaid Other | Source: Ambulatory Visit | Attending: Obstetrics & Gynecology | Admitting: Obstetrics & Gynecology

## 2018-10-11 ENCOUNTER — Ambulatory Visit (INDEPENDENT_AMBULATORY_CARE_PROVIDER_SITE_OTHER): Payer: Medicaid Other

## 2018-10-11 ENCOUNTER — Telehealth: Payer: Self-pay | Admitting: Family Medicine

## 2018-10-11 DIAGNOSIS — O3680X0 Pregnancy with inconclusive fetal viability, not applicable or unspecified: Secondary | ICD-10-CM | POA: Diagnosis not present

## 2018-10-11 DIAGNOSIS — Z712 Person consulting for explanation of examination or test findings: Secondary | ICD-10-CM

## 2018-10-11 NOTE — Telephone Encounter (Signed)
Spoke with patient about needing to change her appointment.

## 2018-10-11 NOTE — Progress Notes (Signed)
Pt here today for pregnancy [place] results.  Notified Dr. Hulan Fray pt's results.  Pt notified that she has simple cyst that will not cause any harm to her or baby.  Pt informed that she has a viable pregnancy with FHR 144 bpm, EDD 05/30/2019, and 7w 0d today.  Medications reconciled.  [place] office to provide proof of pregnancy letter to start prenatal care.   Mel Almond, RN 10/08/18

## 2018-10-12 NOTE — Progress Notes (Signed)
I have reviewed this chart and agree with the RN/CMA assessment and management.    Alp Goldwater C Jaymee Tilson, MD, FACOG Attending Physician, [facility]   

## 2018-10-31 ENCOUNTER — Telehealth: Payer: Medicaid Other

## 2018-11-02 ENCOUNTER — Telehealth: Payer: Medicaid Other

## 2018-11-13 ENCOUNTER — Telehealth: Payer: Self-pay | Admitting: Family Medicine

## 2018-11-13 NOTE — Telephone Encounter (Signed)
Called the patient to confirm the upcoming appointment. Received the message the mailbox is full and cannot receive messages at this time. Good-bye. °

## 2018-11-14 ENCOUNTER — Telehealth: Payer: Self-pay | Admitting: Obstetrics and Gynecology

## 2018-11-14 ENCOUNTER — Encounter: Payer: Medicaid Other | Admitting: Obstetrics and Gynecology

## 2018-11-14 NOTE — Telephone Encounter (Signed)
Called the patient to follow up on missed appointment. The patient stated she started care with someone else.

## 2019-02-23 NOTE — L&D Delivery Note (Signed)
Operative Delivery Note At 4:13 PM a viable female was delivered via Vaginal, Vacuum Investment banker, operational).  Presentation: vertex; Position: Left,, Occiput,, Anterior; Station: +2.  Verbal consent: obtained from patient.  Risks and benefits discussed in detail.  Risks include, but are not limited to the risks of anesthesia, bleeding, infection, damage to maternal tissues, fetal cephalhematoma.  There is also the risk of inability to effect vaginal delivery of the head, or shoulder dystocia that cannot be resolved by established maneuvers, leading to the need for emergency cesarean section.  Vacuum assistance offered due to FHR losing variability.  Mushroom cup used with 2 ctx, no pop-offs, green zone  APGAR: 8, 9; weight pending.   Placenta status: spontaneous, intact.   Cord:  with the following complications: nuchal x1, delivered through.   Anesthesia:  Epidural Instruments: Mushroom cup Episiotomy: None Lacerations: 1st degree;Vaginal;Labial Suture Repair: 3.0 vicryl rapide Est. Blood Loss (mL):  300  2 small vulvar skin tags at 2 and 3:00 removed sharply at pt request  Mom to postpartum.  Baby to Couplet care / Skin to Skin.  Holly Jacobs 05/01/2019, 4:46 PM

## 2019-03-30 ENCOUNTER — Other Ambulatory Visit: Payer: Self-pay

## 2019-03-30 ENCOUNTER — Encounter (HOSPITAL_COMMUNITY): Payer: Self-pay | Admitting: Obstetrics and Gynecology

## 2019-03-30 ENCOUNTER — Inpatient Hospital Stay (HOSPITAL_COMMUNITY)
Admission: AD | Admit: 2019-03-30 | Discharge: 2019-03-30 | Disposition: A | Discharge status: Home / Self Care | Payer: Medicaid Other | Attending: Obstetrics and Gynecology | Admitting: Obstetrics and Gynecology

## 2019-03-30 DIAGNOSIS — Y9301 Activity, walking, marching and hiking: Secondary | ICD-10-CM | POA: Insufficient documentation

## 2019-03-30 DIAGNOSIS — O26893 Other specified pregnancy related conditions, third trimester: Secondary | ICD-10-CM | POA: Diagnosis present

## 2019-03-30 DIAGNOSIS — O163 Unspecified maternal hypertension, third trimester: Secondary | ICD-10-CM | POA: Insufficient documentation

## 2019-03-30 DIAGNOSIS — Z6791 Unspecified blood type, Rh negative: Secondary | ICD-10-CM | POA: Diagnosis not present

## 2019-03-30 DIAGNOSIS — Z3689 Encounter for other specified antenatal screening: Secondary | ICD-10-CM | POA: Diagnosis not present

## 2019-03-30 DIAGNOSIS — O10913 Unspecified pre-existing hypertension complicating pregnancy, third trimester: Secondary | ICD-10-CM | POA: Diagnosis not present

## 2019-03-30 DIAGNOSIS — W19XXXA Unspecified fall, initial encounter: Secondary | ICD-10-CM

## 2019-03-30 DIAGNOSIS — Z79899 Other long term (current) drug therapy: Secondary | ICD-10-CM | POA: Insufficient documentation

## 2019-03-30 DIAGNOSIS — M25562 Pain in left knee: Secondary | ICD-10-CM | POA: Insufficient documentation

## 2019-03-30 DIAGNOSIS — Z3A31 31 weeks gestation of pregnancy: Secondary | ICD-10-CM | POA: Diagnosis not present

## 2019-03-30 LAB — URINALYSIS, ROUTINE W REFLEX MICROSCOPIC
Bilirubin Urine: NEGATIVE
Glucose, UA: NEGATIVE mg/dL
Hgb urine dipstick: NEGATIVE
Ketones, ur: NEGATIVE mg/dL
Nitrite: NEGATIVE
Protein, ur: NEGATIVE mg/dL
Specific Gravity, Urine: 1.019 (ref 1.005–1.030)
pH: 6 (ref 5.0–8.0)

## 2019-03-30 LAB — COMPREHENSIVE METABOLIC PANEL
ALT: 14 U/L (ref 0–44)
AST: 22 U/L (ref 15–41)
Albumin: 2.7 g/dL — ABNORMAL LOW (ref 3.5–5.0)
Alkaline Phosphatase: 83 U/L (ref 38–126)
Anion gap: 11 (ref 5–15)
BUN: 7 mg/dL (ref 6–20)
CO2: 22 mmol/L (ref 22–32)
Calcium: 8.9 mg/dL (ref 8.9–10.3)
Chloride: 105 mmol/L (ref 98–111)
Creatinine, Ser: 0.58 mg/dL (ref 0.44–1.00)
GFR calc Af Amer: >60 mL/min (ref >60)
GFR calc non Af Amer: >60 mL/min (ref >60)
Glucose, Bld: 92 mg/dL (ref 70–99)
Potassium: 3.6 mmol/L (ref 3.5–5.1)
Sodium: 138 mmol/L (ref 135–145)
Total Bilirubin: 0.1 mg/dL — ABNORMAL LOW (ref 0.3–1.2)
Total Protein: 5.7 g/dL — ABNORMAL LOW (ref 6.5–8.1)

## 2019-03-30 LAB — PROTEIN / CREATININE RATIO, URINE
Creatinine, Urine: 124.69 mg/dL
Protein Creatinine Ratio: 0.12 mg/mg{Cre} (ref 0.00–0.15)
Total Protein, Urine: 15 mg/dL

## 2019-03-30 LAB — CBC
HCT: 34.2 % — ABNORMAL LOW (ref 36.0–46.0)
Hemoglobin: 11.4 g/dL — ABNORMAL LOW (ref 12.0–15.0)
MCH: 29.9 pg (ref 26.0–34.0)
MCHC: 33.3 g/dL (ref 30.0–36.0)
MCV: 89.8 fL (ref 80.0–100.0)
Platelets: 297 10*3/uL (ref 150–400)
RBC: 3.81 MIL/uL — ABNORMAL LOW (ref 3.87–5.11)
RDW: 12.5 % (ref 11.5–15.5)
WBC: 10.7 10*3/uL — ABNORMAL HIGH (ref 4.0–10.5)
nRBC: 0 % (ref 0.0–0.2)

## 2019-03-30 NOTE — MAU Provider Note (Addendum)
History     CSN: [id number]  Arrival date and time: 03/30/19 1212   First Provider Initiated Contact with Patient 03/30/19 1325      Chief Complaint  Patient presents with  . Fall   Holly Jacobs is a 24 y.o. G1P0 at [redacted]w[redacted]d who presents today after a fall. She states that at 0830 today she fell going down a ramp from her building. She landed on her right hip and left knee. She did not have a direct hit to the abdomen. She She denies any contractions, VB or  LOF since the fall. She reports that the baby has been moving since the fall, but it seems slightly less than normal. She reports back pain and knee pain. She reports that she been having back pain and thinks it is unrelated to the fall. She rates her knee pain 3/10 at this time. She took tylenol for the pain earlier, but that did not help with the pain. Next OB visit 04/10/2019  Fall The accident occurred 3 to 6 hours ago. The fall occurred while walking. She fell from a height of 3 to 5 ft. She landed on concrete. There was no blood loss. The point of impact was the left knee and right hip. The pain is present in the left knee. The pain is at a severity of 3/10. Pertinent negatives include no fever. She has tried acetaminophen for the symptoms. The treatment provided no relief.    OB History    Gravida  1   Para      Term      Preterm      AB      Living        SAB      TAB      Ectopic      Multiple      Live Births              Past Medical History:  Diagnosis Date  . Depression   . Hypertension   . Migraine     Past Surgical History:  Procedure Laterality Date  . CYSTOSCOPY W/ URETERAL STENT REMOVAL Right 12/30/2012   Procedure: CYSTOSCOPY WITH STENT REMOVAL;  Surgeon: Ky Barban, MD;  Location: [facility];  Service: Urology;  Laterality: Right;  . CYSTOSCOPY WITH RETROGRADE PYELOGRAM, URETEROSCOPY AND STENT PLACEMENT Left 12/29/2012   Procedure: CYSTOSCOPY WITH LEFT RETROGRADE PYELOGRAM,  BALLOON DILATION LEFT URETER; LEFT URETEROSCOPY ;  Surgeon: Ky Barban, MD;  Location: [facility];  Service: Urology;  Laterality: Left;  . CYSTOSCOPY WITH STENT PLACEMENT Right 12/29/2012   Procedure: CYSTOSCOPY WITH STENT PLACEMENT RIGHT URETER;  Surgeon: Ky Barban, MD;  Location: [facility];  Service: Urology;  Laterality: Right;    Family History  Problem Relation Age of Onset  . Migraines Father   . Skin cancer Maternal Grandmother   . Diabetes Maternal Grandmother   . High Cholesterol Maternal Grandmother     Social History   Tobacco Use  . Smoking status: Never Smoker  . Smokeless tobacco: Never Used  Substance Use Topics  . Alcohol use: No  . Drug use: No    Allergies:  Allergies  Allergen Reactions  . Imitrex [Sumatriptan Base] Anaphylaxis, Shortness Of Breath and Itching  . Sulfa Antibiotics Anaphylaxis  . Latex Hives  . Tramadol Hives    Medications Prior to Admission  Medication Sig Dispense Refill Last Dose  . Prenatal Vit-Fe Fumarate-FA (PREPLUS) 27-1 MG TABS Take 1 tablet  by mouth daily.     . promethazine (PHENERGAN) 25 MG tablet Take 1 tablet (25 mg total) by mouth every 6 (six) hours as needed for nausea or vomiting. 30 tablet 0     Review of Systems  Constitutional: Negative for chills and fever.  Genitourinary: Negative for pelvic pain, vaginal bleeding and vaginal discharge.  Neurological: Negative for dizziness.   Physical Exam   Blood pressure 140/76, pulse 94, temperature 98.6 F (37 C), temperature source Oral, resp. rate 18, height 5\' 5"  (1.651 m), weight 93 kg, last menstrual period 08/23/2018, SpO2 99 %.  Physical Exam  Nursing note and vitals reviewed. Constitutional: She is oriented to person, place, and time. She appears well-developed and well-nourished. No distress.  HENT:  Head: Normocephalic.  Cardiovascular: Normal rate.  Respiratory: Effort normal.  GI: Soft. There is no abdominal tenderness. There is no rebound.   Neurological: She is alert and oriented to person, place, and time.  Skin: Skin is warm and dry.  Psychiatric: She has a normal mood and affect.    NST:  Baseline: 135 Variability: moderate Accels: 15x15 Decels: none Toco: none Reactive  Results for orders placed or performed during the hospital encounter of 03/30/19 (from the past 24 hour(s))  Urinalysis, Routine w reflex microscopic     Status: Abnormal   Collection Time: 03/30/19  1:22 PM  Result Value Ref Range   Color, Urine YELLOW YELLOW   APPearance CLOUDY (A) CLEAR   Specific Gravity, Urine 1.019 1.005 - 1.030   pH 6.0 5.0 - 8.0   Glucose, UA NEGATIVE NEGATIVE mg/dL   Hgb urine dipstick NEGATIVE NEGATIVE   Bilirubin Urine NEGATIVE NEGATIVE   Ketones, ur NEGATIVE NEGATIVE mg/dL   Protein, ur NEGATIVE NEGATIVE mg/dL   Nitrite NEGATIVE NEGATIVE   Leukocytes,Ua TRACE (A) NEGATIVE   RBC / HPF 0-5 0 - 5 RBC/hpf   WBC, UA 0-5 0 - 5 WBC/hpf   Bacteria, UA FEW (A) NONE SEEN   Squamous Epithelial / LPF 6-10 0 - 5   Mucus PRESENT   Protein / creatinine ratio, urine     Status: None   Collection Time: 03/30/19  1:22 PM  Result Value Ref Range   Creatinine, Urine 124.69 mg/dL   Total Protein, Urine 15 mg/dL   Protein Creatinine Ratio 0.12 0.00 - 0.15 mg/mg[Cre]  CBC     Status: Abnormal   Collection Time: 03/30/19  3:35 PM  Result Value Ref Range   WBC 10.7 (H) 4.0 - 10.5 K/uL   RBC 3.81 (L) 3.87 - 5.11 MIL/uL   Hemoglobin 11.4 (L) 12.0 - 15.0 g/dL   HCT 34.2 (L) 36.0 - 46.0 %   MCV 89.8 80.0 - 100.0 fL   MCH 29.9 26.0 - 34.0 pg   MCHC 33.3 30.0 - 36.0 g/dL   RDW 12.5 11.5 - 15.5 %   Platelets 297 150 - 400 K/uL   nRBC 0.0 0.0 - 0.2 %  Comprehensive metabolic panel     Status: Abnormal   Collection Time: 03/30/19  3:35 PM  Result Value Ref Range   Sodium 138 135 - 145 mmol/L   Potassium 3.6 3.5 - 5.1 mmol/L   Chloride 105 98 - 111 mmol/L   CO2 22 22 - 32 mmol/L   Glucose, Bld 92 70 - 99 mg/dL   BUN 7 6 - 20  mg/dL   Creatinine, Ser 0.58 0.44 - 1.00 mg/dL   Calcium 8.9 8.9 - 10.3 mg/dL   Total  Protein 5.7 (L) 6.5 - 8.1 g/dL   Albumin 2.7 (L) 3.5 - 5.0 g/dL   AST 22 15 - 41 U/L   ALT 14 0 - 44 U/L   Alkaline Phosphatase 83 38 - 126 U/L   Total Bilirubin 0.1 (L) 0.3 - 1.2 mg/dL   GFR calc non Af Amer >60 >60 mL/min   GFR calc Af Amer >60 >60 mL/min   Anion gap 11 5 - 15   Patient Vitals for the past 24 hrs:  BP Temp Temp src Pulse Resp SpO2 Height Weight  03/30/19 1646 133/78 -- -- 96 -- 98 % -- --  03/30/19 1631 140/76 -- -- 94 -- -- -- --  03/30/19 1616 (!) 156/80 -- -- (!) 103 -- -- -- --  03/30/19 1601 (!) 144/85 -- -- 89 -- -- -- --  03/30/19 1531 (!) 151/81 -- -- 98 -- -- -- --  03/30/19 1516 140/86 -- -- 98 -- -- -- --  03/30/19 1506 (!) 150/81 -- -- 97 -- -- -- --  03/30/19 1451 (!) 145/82 -- -- 94 -- -- -- --  03/30/19 1309 129/79 -- -- 94 -- -- -- --  03/30/19 1241 139/79 98.6 F (37 C) Oral 98 18 99 % 5\' 5"  (1.651 m) 93 kg     MAU Course  Procedures  MDM 1439: Consult with Dr. , ok for DC home.  1507: On DC patient was found to have 145/82 blood pressure. Denies any symptoms (HA, visual disturbance). Patient reports hx of CHTN. Will check pre-eclampsia labs. Patient has been mostly normotensive at her office visits. She had one 130/90 bp and some that were 130/80s. Will leave message for patient to be seen in the office on Monday for BP check. Labs normal today.  1700: Spoke with Dr. Monday and Dr. Marice Potter reviewed labs and vital signs. Dr. Senaida Ores will send message to her office to schedule patient for visit next week for blood pressure check.   Assessment and Plan   1. Fall, initial encounter   2. Blood type, Rh negative   3. [redacted] weeks gestation of pregnancy   4. NST (non-stress test) reactive    DC home 3rd Trimester precautions  Bleeding precautions PTL precautions  Fetal kick counts RX: none  Return to MAU as needed Office will call for an appt  next week   Follow-up Information    Senaida Ores, MD Follow up.   Specialty: Obstetrics and Gynecology Contact information: [address] [place] [phone]          [phone] DNP, CNM  03/30/19  4:44 PM

## 2019-03-30 NOTE — Discharge Instructions (Signed)
Third Trimester of Pregnancy The third trimester is from week 28 through week 40 (months 7 through 9). The third trimester is a time when the unborn baby (fetus) is growing rapidly. At the end of the ninth month, the fetus is about 20 inches in length and weighs 6-10 pounds. Body changes during your third trimester Your body will continue to go through many changes during pregnancy. The changes vary from woman to woman. During the third trimester:  Your weight will continue to increase. You can expect to gain 25-35 pounds (11-16 kg) by the end of the pregnancy.  You may begin to get stretch marks on your hips, abdomen, and breasts.  You may urinate more often because the fetus is moving lower into your pelvis and pressing on your bladder.  You may develop or continue to have heartburn. This is caused by increased hormones that slow down muscles in the digestive tract.  You may develop or continue to have constipation because increased hormones slow digestion and cause the muscles that push waste through your intestines to relax.  You may develop hemorrhoids. These are swollen veins (varicose veins) in the rectum that can itch or be painful.  You may develop swollen, bulging veins (varicose veins) in your legs.  You may have increased body aches in the pelvis, back, or thighs. This is due to weight gain and increased hormones that are relaxing your joints.  You may have changes in your hair. These can include thickening of your hair, rapid growth, and changes in texture. Some women also have hair loss during or after pregnancy, or hair that feels dry or thin. Your hair will most likely return to normal after your baby is born.  Your breasts will continue to grow and they will continue to become tender. A yellow fluid (colostrum) may leak from your breasts. This is the first milk you are producing for your baby.  Your belly button may stick out.  You may notice more swelling in your hands,  face, or ankles.  You may have increased tingling or numbness in your hands, arms, and legs. The skin on your belly may also feel numb.  You may feel short of breath because of your expanding uterus.  You may have more problems sleeping. This can be caused by the size of your belly, increased need to urinate, and an increase in your body's metabolism.  You may notice the fetus "dropping," or moving lower in your abdomen (lightening).  You may have increased vaginal discharge.  You may notice your joints feel loose and you may have pain around your pelvic bone. What to expect at prenatal visits You will have prenatal exams every 2 weeks until week 36. Then you will have weekly prenatal exams. During a routine prenatal visit:  You will be weighed to make sure you and the baby are growing normally.  Your blood pressure will be taken.  Your abdomen will be measured to track your baby's growth.  The fetal heartbeat will be listened to.  Any test results from the previous visit will be discussed.  You may have a cervical check near your due date to see if your cervix has softened or thinned (effaced).  You will be tested for Group B streptococcus. This happens between 35 and 37 weeks. Your health care provider may ask you:  What your birth plan is.  How you are feeling.  If you are feeling the baby move.  If you have had any abnormal   symptoms, such as leaking fluid, bleeding, severe headaches, or abdominal cramping.  If you are using any tobacco products, including cigarettes, chewing tobacco, and electronic cigarettes.  If you have any questions. Other tests or screenings that may be performed during your third trimester include:  Blood tests that check for low iron levels (anemia).  Fetal testing to check the health, activity level, and growth of the fetus. Testing is done if you have certain medical conditions or if there are problems during the pregnancy.  Nonstress test  (NST). This test checks the health of your baby to make sure there are no signs of problems, such as the baby not getting enough oxygen. During this test, a belt is placed around your belly. The baby is made to move, and its heart rate is monitored during movement. What is false labor? False labor is a condition in which you feel small, irregular tightenings of the muscles in the womb (contractions) that usually go away with rest, changing position, or drinking water. These are called Braxton Hicks contractions. Contractions may last for hours, days, or even weeks before true labor sets in. If contractions come at regular intervals, become more frequent, increase in intensity, or become painful, you should see your health care provider. What are the signs of labor?  Abdominal cramps.  Regular contractions that start at 10 minutes apart and become stronger and more frequent with time.  Contractions that start on the top of the uterus and spread down to the lower abdomen and back.  Increased pelvic pressure and dull back pain.  A watery or bloody mucus discharge that comes from the vagina.  Leaking of amniotic fluid. This is also known as your "water breaking." It could be a slow trickle or a gush. Let your health care provider know if it has a color or strange odor. If you have any of these signs, call your health care provider right away, even if it is before your due date. Follow these instructions at home: Medicines  Follow your health care provider's instructions regarding medicine use. Specific medicines may be either safe or unsafe to take during pregnancy.  Take a prenatal vitamin that contains at least 600 micrograms (mcg) of folic acid.  If you develop constipation, try taking a stool softener if your health care provider approves. Eating and drinking   Eat a balanced diet that includes fresh fruits and vegetables, whole grains, good sources of protein such as meat, eggs, or tofu,  and low-fat dairy. Your health care provider will help you determine the amount of weight gain that is right for you.  Avoid raw meat and uncooked cheese. These carry germs that can cause birth defects in the baby.  If you have low calcium intake from food, talk to your health care provider about whether you should take a daily calcium supplement.  Eat four or five small meals rather than three large meals a day.  Limit foods that are high in fat and processed sugars, such as fried and sweet foods.  To prevent constipation: ? Drink enough fluid to keep your urine clear or pale yellow. ? Eat foods that are high in fiber, such as fresh fruits and vegetables, whole grains, and beans. Activity  Exercise only as directed by your health care provider. Most women can continue their usual exercise routine during pregnancy. Try to exercise for 30 minutes at least 5 days a week. Stop exercising if you experience uterine contractions.  Avoid heavy lifting.  Do   not exercise in extreme heat or humidity, or at high altitudes.  Wear low-heel, comfortable shoes.  Practice good posture.  You may continue to have sex unless your health care provider tells you otherwise. Relieving pain and discomfort  Take frequent breaks and rest with your legs elevated if you have leg cramps or low back pain.  Take warm sitz baths to soothe any pain or discomfort caused by hemorrhoids. Use hemorrhoid cream if your health care provider approves.  Wear a good support bra to prevent discomfort from breast tenderness.  If you develop varicose veins: ? Wear support pantyhose or compression stockings as told by your healthcare provider. ? Elevate your feet for 15 minutes, 3-4 times a day. Prenatal care  Write down your questions. Take them to your prenatal visits.  Keep all your prenatal visits as told by your health care provider. This is important. Safety  Wear your seat belt at all times when driving.  Make  a list of emergency phone numbers, including numbers for family, friends, the hospital, and police and fire departments. General instructions  Avoid cat litter boxes and soil used by cats. These carry germs that can cause birth defects in the baby. If you have a cat, ask someone to clean the litter box for you.  Do not travel far distances unless it is absolutely necessary and only with the approval of your health care provider.  Do not use hot tubs, steam rooms, or saunas.  Do not drink alcohol.  Do not use any products that contain nicotine or tobacco, such as cigarettes and e-cigarettes. If you need help quitting, ask your health care provider.  Do not use any medicinal herbs or unprescribed drugs. These chemicals affect the formation and growth of the baby.  Do not douche or use tampons or scented sanitary pads.  Do not cross your legs for long periods of time.  To prepare for the arrival of your baby: ? Take prenatal classes to understand, practice, and ask questions about labor and delivery. ? Make a trial run to the hospital. ? Visit the hospital and tour the maternity area. ? Arrange for maternity or paternity leave through employers. ? Arrange for family and friends to take care of pets while you are in the hospital. ? Purchase a rear-facing car seat and make sure you know how to install it in your car. ? Pack your hospital bag. ? Prepare the baby's nursery. Make sure to remove all pillows and stuffed animals from the baby's crib to prevent suffocation.  Visit your dentist if you have not gone during your pregnancy. Use a soft toothbrush to brush your teeth and be gentle when you floss. Contact a health care provider if:  You are unsure if you are in labor or if your water has broken.  You become dizzy.  You have mild pelvic cramps, pelvic pressure, or nagging pain in your abdominal area.  You have lower back pain.  You have persistent nausea, vomiting, or  diarrhea.  You have an unusual or bad smelling vaginal discharge.  You have pain when you urinate. Get help right away if:  Your water breaks before 37 weeks.  You have regular contractions less than 5 minutes apart before 37 weeks.  You have a fever.  You are leaking fluid from your vagina.  You have spotting or bleeding from your vagina.  You have severe abdominal pain or cramping.  You have rapid weight loss or weight gain.  You have   shortness of breath with chest pain.  You notice sudden or extreme swelling of your face, hands, ankles, feet, or legs.  Your baby makes fewer than 10 movements in 2 hours.  You have severe headaches that do not go away when you take medicine.  You have vision changes. Summary  The third trimester is from week 28 through week 40, months 7 through 9. The third trimester is a time when the unborn baby (fetus) is growing rapidly.  During the third trimester, your discomfort may increase as you and your baby continue to gain weight. You may have abdominal, leg, and back pain, sleeping problems, and an increased need to urinate.  During the third trimester your breasts will keep growing and they will continue to become tender. A yellow fluid (colostrum) may leak from your breasts. This is the first milk you are producing for your baby.  False labor is a condition in which you feel small, irregular tightenings of the muscles in the womb (contractions) that eventually go away. These are called Braxton Hicks contractions. Contractions may last for hours, days, or even weeks before true labor sets in.  Signs of labor can include: abdominal cramps; regular contractions that start at 10 minutes apart and become stronger and more frequent with time; watery or bloody mucus discharge that comes from the vagina; increased pelvic pressure and dull back pain; and leaking of amniotic fluid. This information is not intended to replace advice given to you by your  health care provider. Make sure you discuss any questions you have with your health care provider. Document Revised: 06/01/2018 Document Reviewed: 03/16/2016 [employer] Patient Education  2020 [employer]  

## 2019-03-30 NOTE — MAU Note (Signed)
About 0830, fell outside landed on left knee, rt hip. Did not hit belly.  Left knee and low back are hurting, no bruising noted. No bleeding.

## 2019-04-04 ENCOUNTER — Inpatient Hospital Stay (HOSPITAL_COMMUNITY)
Admission: AD | Admit: 2019-04-04 | Discharge: 2019-04-04 | Disposition: A | Discharge status: Home / Self Care | Payer: Medicaid Other | Attending: Obstetrics and Gynecology | Admitting: Obstetrics and Gynecology

## 2019-04-04 ENCOUNTER — Encounter (HOSPITAL_COMMUNITY): Payer: Self-pay | Admitting: Obstetrics and Gynecology

## 2019-04-04 ENCOUNTER — Other Ambulatory Visit: Payer: Self-pay

## 2019-04-04 DIAGNOSIS — Z808 Family history of malignant neoplasm of other organs or systems: Secondary | ICD-10-CM | POA: Insufficient documentation

## 2019-04-04 DIAGNOSIS — Z91040 Latex allergy status: Secondary | ICD-10-CM | POA: Insufficient documentation

## 2019-04-04 DIAGNOSIS — Z885 Allergy status to narcotic agent status: Secondary | ICD-10-CM | POA: Insufficient documentation

## 2019-04-04 DIAGNOSIS — Z6791 Unspecified blood type, Rh negative: Secondary | ICD-10-CM

## 2019-04-04 DIAGNOSIS — Z833 Family history of diabetes mellitus: Secondary | ICD-10-CM | POA: Diagnosis not present

## 2019-04-04 DIAGNOSIS — O139 Gestational [pregnancy-induced] hypertension without significant proteinuria, unspecified trimester: Secondary | ICD-10-CM

## 2019-04-04 DIAGNOSIS — O26893 Other specified pregnancy related conditions, third trimester: Secondary | ICD-10-CM | POA: Insufficient documentation

## 2019-04-04 DIAGNOSIS — Z882 Allergy status to sulfonamides status: Secondary | ICD-10-CM | POA: Diagnosis not present

## 2019-04-04 DIAGNOSIS — Z3A32 32 weeks gestation of pregnancy: Secondary | ICD-10-CM

## 2019-04-04 DIAGNOSIS — Z888 Allergy status to other drugs, medicaments and biological substances status: Secondary | ICD-10-CM | POA: Diagnosis not present

## 2019-04-04 DIAGNOSIS — O133 Gestational [pregnancy-induced] hypertension without significant proteinuria, third trimester: Secondary | ICD-10-CM | POA: Insufficient documentation

## 2019-04-04 LAB — URINALYSIS, ROUTINE W REFLEX MICROSCOPIC
Bilirubin Urine: NEGATIVE
Glucose, UA: NEGATIVE mg/dL
Hgb urine dipstick: NEGATIVE
Ketones, ur: NEGATIVE mg/dL
Leukocytes,Ua: NEGATIVE
Nitrite: NEGATIVE
Protein, ur: NEGATIVE mg/dL
Specific Gravity, Urine: 1.005 (ref 1.005–1.030)
pH: 7 (ref 5.0–8.0)

## 2019-04-04 LAB — CBC
HCT: 33.6 % — ABNORMAL LOW (ref 36.0–46.0)
Hemoglobin: 11.5 g/dL — ABNORMAL LOW (ref 12.0–15.0)
MCH: 30.6 pg (ref 26.0–34.0)
MCHC: 34.2 g/dL (ref 30.0–36.0)
MCV: 89.4 fL (ref 80.0–100.0)
Platelets: 283 10*3/uL (ref 150–400)
RBC: 3.76 MIL/uL — ABNORMAL LOW (ref 3.87–5.11)
RDW: 12.6 % (ref 11.5–15.5)
WBC: 10.8 10*3/uL — ABNORMAL HIGH (ref 4.0–10.5)
nRBC: 0 % (ref 0.0–0.2)

## 2019-04-04 LAB — COMPREHENSIVE METABOLIC PANEL
ALT: 14 U/L (ref 0–44)
AST: 18 U/L (ref 15–41)
Albumin: 2.5 g/dL — ABNORMAL LOW (ref 3.5–5.0)
Alkaline Phosphatase: 92 U/L (ref 38–126)
Anion gap: 9 (ref 5–15)
BUN: 6 mg/dL (ref 6–20)
CO2: 24 mmol/L (ref 22–32)
Calcium: 8.9 mg/dL (ref 8.9–10.3)
Chloride: 106 mmol/L (ref 98–111)
Creatinine, Ser: 0.57 mg/dL (ref 0.44–1.00)
GFR calc Af Amer: >60 mL/min (ref >60)
GFR calc non Af Amer: >60 mL/min (ref >60)
Glucose, Bld: 96 mg/dL (ref 70–99)
Potassium: 3.5 mmol/L (ref 3.5–5.1)
Sodium: 139 mmol/L (ref 135–145)
Total Bilirubin: 0.5 mg/dL (ref 0.3–1.2)
Total Protein: 5.7 g/dL — ABNORMAL LOW (ref 6.5–8.1)

## 2019-04-04 LAB — PROTEIN / CREATININE RATIO, URINE
Creatinine, Urine: 34.89 mg/dL
Total Protein, Urine: <6 mg/dL

## 2019-04-04 LAB — URIC ACID: Uric Acid, Serum: 4.3 mg/dL (ref 2.5–7.1)

## 2019-04-04 NOTE — MAU Provider Note (Signed)
History     CSN: [id number]  Arrival date and time: 04/04/19 1659   First Provider Initiated Contact with Patient 04/04/19 1847      Chief Complaint  Patient presents with  . Hypertension   HPI   [name] is 24 y.o. female G1P0 @ [redacted]w[redacted]d here in MAU with elevated BP readings in the office. She was sent by her Dr. Isidore Moos for further BP evaluation. She was seen on 2/5 with elevated BP's.  + headaches here and there over the last week that have been relieved with tylenol.  Occasional spots in her vision. None now. + fetal movement, no abdominal pain. Has not taken any tylenol today.   OB History    Gravida  1   Para      Term      Preterm      AB      Living        SAB      TAB      Ectopic      Multiple      Live Births              Past Medical History:  Diagnosis Date  . Depression   . Hypertension   . Migraine     Past Surgical History:  Procedure Laterality Date  . CYSTOSCOPY W/ URETERAL STENT REMOVAL Right 12/30/2012   Procedure: CYSTOSCOPY WITH STENT REMOVAL;  Surgeon: Ky Barban, MD;  Location: [facility];  Service: Urology;  Laterality: Right;  . CYSTOSCOPY WITH RETROGRADE PYELOGRAM, URETEROSCOPY AND STENT PLACEMENT Left 12/29/2012   Procedure: CYSTOSCOPY WITH LEFT RETROGRADE PYELOGRAM, BALLOON DILATION LEFT URETER; LEFT URETEROSCOPY ;  Surgeon: Ky Barban, MD;  Location: [facility];  Service: Urology;  Laterality: Left;  . CYSTOSCOPY WITH STENT PLACEMENT Right 12/29/2012   Procedure: CYSTOSCOPY WITH STENT PLACEMENT RIGHT URETER;  Surgeon: Ky Barban, MD;  Location: [facility];  Service: Urology;  Laterality: Right;    Family History  Problem Relation Age of Onset  . Migraines Father   . Skin cancer Maternal Grandmother   . Diabetes Maternal Grandmother   . High Cholesterol Maternal Grandmother     Social History   Tobacco Use  . Smoking status: Never Smoker  . Smokeless tobacco: Never Used  Substance Use Topics  .  Alcohol use: No  . Drug use: No    Allergies:  Allergies  Allergen Reactions  . Imitrex [Sumatriptan Base] Anaphylaxis, Shortness Of Breath and Itching  . Sulfa Antibiotics Anaphylaxis  . Latex Hives  . Tramadol Hives    Medications Prior to Admission  Medication Sig Dispense Refill Last Dose  . cyclobenzaprine (FLEXERIL) 10 MG tablet Take 10 mg by mouth 3 (three) times daily as needed for muscle spasms.   04/03/2019 at 2100  . Prenatal Vit-Fe Fumarate-FA (PREPLUS) 27-1 MG TABS Take 1 tablet by mouth daily.   04/04/2019 at 1000  . promethazine (PHENERGAN) 25 MG tablet Take 1 tablet (25 mg total) by mouth every 6 (six) hours as needed for nausea or vomiting. 30 tablet 0 04/04/2019 at 0330   Results for orders placed or performed during the hospital encounter of 04/04/19 (from the past 48 hour(s))  Urinalysis, Routine w reflex microscopic     Status: Abnormal   Collection Time: 04/04/19  5:38 PM  Result Value Ref Range   Color, Urine STRAW (A) YELLOW   APPearance CLEAR CLEAR   Specific Gravity, Urine 1.005 1.005 - 1.030  pH 7.0 5.0 - 8.0   Glucose, UA NEGATIVE NEGATIVE mg/dL   Hgb urine dipstick NEGATIVE NEGATIVE   Bilirubin Urine NEGATIVE NEGATIVE   Ketones, ur NEGATIVE NEGATIVE mg/dL   Protein, ur NEGATIVE NEGATIVE mg/dL   Nitrite NEGATIVE NEGATIVE   Leukocytes,Ua NEGATIVE NEGATIVE    Comment: Performed at [facility], [address] [place], [place] [address]  Protein / creatinine ratio, urine     Status: None   Collection Time: 04/04/19  6:06 PM  Result Value Ref Range   Creatinine, Urine 34.89 mg/dL   Total Protein, Urine <6 mg/dL    Comment: NO NORMAL RANGE ESTABLISHED FOR THIS TEST   Protein Creatinine Ratio        0.00 - 0.15 mg/mg[Cre]    Comment: RESULT BELOW REPORTABLE RANGE, UNABLE TO CALCULATE. Performed at [facility], [address], [place], [place] [address]   CBC     Status: Abnormal   Collection Time: 04/04/19  6:54 PM  Result Value Ref  Range   WBC 10.8 (H) 4.0 - 10.5 K/uL   RBC 3.76 (L) 3.87 - 5.11 MIL/uL   Hemoglobin 11.5 (L) 12.0 - 15.0 g/dL   HCT 18.8 (L) 41.6 - 60.6 %   MCV 89.4 80.0 - 100.0 fL   MCH 30.6 26.0 - 34.0 pg   MCHC 34.2 30.0 - 36.0 g/dL   RDW 30.1 60.1 - 09.3 %   Platelets 283 150 - 400 K/uL   nRBC 0.0 0.0 - 0.2 %    Comment: Performed at [facility], [address], [place], [place] [address]  Comprehensive metabolic panel     Status: Abnormal   Collection Time: 04/04/19  6:54 PM  Result Value Ref Range   Sodium 139 135 - 145 mmol/L   Potassium 3.5 3.5 - 5.1 mmol/L   Chloride 106 98 - 111 mmol/L   CO2 24 22 - 32 mmol/L   Glucose, Bld 96 70 - 99 mg/dL   BUN 6 6 - 20 mg/dL   Creatinine, Ser 3.22 0.44 - 1.00 mg/dL   Calcium 8.9 8.9 - 02.5 mg/dL   Total Protein 5.7 (L) 6.5 - 8.1 g/dL   Albumin 2.5 (L) 3.5 - 5.0 g/dL   AST 18 15 - 41 U/L   ALT 14 0 - 44 U/L   Alkaline Phosphatase 92 38 - 126 U/L   Total Bilirubin 0.5 0.3 - 1.2 mg/dL   GFR calc non Af Amer >60 >60 mL/min   GFR calc Af Amer >60 >60 mL/min   Anion gap 9 5 - 15    Comment: Performed at [facility], [address] [place], [place] [address]  Uric acid     Status: None   Collection Time: 04/04/19  6:54 PM  Result Value Ref Range   Uric Acid, Serum 4.3 2.5 - 7.1 mg/dL    Comment: Performed at [facility], [address] [place], [place] [address]    Review of Systems  Eyes: Negative for photophobia.  Gastrointestinal: Negative for abdominal pain.  Genitourinary: Negative for vaginal bleeding.  Neurological: Positive for headaches (2/10 left frontal HA which is consistent with HA in the past. ).   Physical Exam   Blood pressure 136/87, pulse 93, resp. rate 16, height 5\' 5"  (1.651 m), weight 94.5 kg, last menstrual period 08/23/2018, SpO2 99 %.  Physical Exam  Constitutional: She is oriented to person, place, and time. She appears well-developed  and well-nourished. No distress.  HENT:  Head:  Normocephalic.  Respiratory: Effort normal.  Musculoskeletal:        General: Normal range of motion.     Cervical back: Neck supple.  Neurological: She is alert and oriented to person, place, and time. She has normal reflexes.  Negative clonus   Skin: Skin is warm. She is not diaphoretic.  Psychiatric: Her behavior is normal.   Fetal Tracing: Baseline: 135 bpm Variability: Moderate  Accelerations: 15x15 Decelerations: None Toco: None   MAU Course  Procedures  None  MDM  PIH labs collected.   2015: patient reports no scotoma, HA 2/10 left front HA, refused Tylenol at this time Discussed patient with Dr. Elonda Husky, discussed a f/u visit with Dr. Melba Coon.   Assessment and Plan   A:  1. Gestational hypertension, antepartum   2. Blood type, Rh negative   3. [redacted] weeks gestation of pregnancy      P:  Discharge home in stable condition Return to MAU if symptoms worsen Strict pre E precautions F/u in the office for a BP check Continue to check BP at home. Discussed normal levels Dr. Melba Coon aware that patient was here  Lezlie Lye, NP 04/04/2019 8:59 PM

## 2019-04-04 NOTE — Discharge Instructions (Signed)

## 2019-04-04 NOTE — MAU Note (Addendum)
Pt sent from Dr Emeline Darling office for hypertension 150/93. Denies headache, has floaters since being here. Swelling is the same for past 2 weeks.  Denies contractions, LOF, VB, +FM.

## 2019-04-13 ENCOUNTER — Other Ambulatory Visit: Payer: Self-pay

## 2019-04-13 ENCOUNTER — Inpatient Hospital Stay (HOSPITAL_COMMUNITY)
Admission: AD | Admit: 2019-04-13 | Discharge: 2019-04-13 | Disposition: A | Discharge status: Home / Self Care | Payer: Medicaid Other | Attending: Obstetrics and Gynecology | Admitting: Obstetrics and Gynecology

## 2019-04-13 DIAGNOSIS — O133 Gestational [pregnancy-induced] hypertension without significant proteinuria, third trimester: Secondary | ICD-10-CM | POA: Diagnosis not present

## 2019-04-13 DIAGNOSIS — Z3A33 33 weeks gestation of pregnancy: Secondary | ICD-10-CM | POA: Diagnosis not present

## 2019-04-13 DIAGNOSIS — Z6791 Unspecified blood type, Rh negative: Secondary | ICD-10-CM

## 2019-04-13 MED ORDER — BETAMETHASONE SOD PHOS & ACET 6 (3-3) MG/ML IJ SUSP
12.0000 mg | Freq: Once | INTRAMUSCULAR | Status: AC
  Administered 2019-04-13: 14:00:00 12 mg via INTRAMUSCULAR
  Filled 2019-04-13: qty 5

## 2019-04-13 NOTE — MAU Note (Signed)
PT received betamethasone shot with no adverse reactions. Left after 20 mins.

## 2019-04-13 NOTE — MAU Provider Note (Signed)
Chief Complaint:  Injections   First Provider Initiated Contact with Patient 04/13/19 1319      HPI: Holly Jacobs is a 24 y.o. G1P0 at [redacted]w[redacted]d who presents to maternity admissions sent from the office for BMZ due to [facility] and possibility of need for early delivery.  She had workup for HTN in office on 04/11/19 with P/C ratio of 0.19. She denies any h/a, epigastric pain, or visual disturbances. There are no other symptoms. She has not needed any treatments. She reports good fetal movement.   HPI  Past Medical History: Past Medical History:  Diagnosis Date  . Depression   . Hypertension   . Migraine     Past obstetric history: OB History  Gravida Para Term Preterm AB Living  1            SAB TAB Ectopic Multiple Live Births               # Outcome Date GA Lbr Len/2nd Weight Sex Delivery Anes PTL Lv  1 Current             Past Surgical History: Past Surgical History:  Procedure Laterality Date  . CYSTOSCOPY W/ URETERAL STENT REMOVAL Right 12/30/2012   Procedure: CYSTOSCOPY WITH STENT REMOVAL;  Surgeon: Ky Barban, MD;  Location: [facility];  Service: Urology;  Laterality: Right;  . CYSTOSCOPY WITH RETROGRADE PYELOGRAM, URETEROSCOPY AND STENT PLACEMENT Left 12/29/2012   Procedure: CYSTOSCOPY WITH LEFT RETROGRADE PYELOGRAM, BALLOON DILATION LEFT URETER; LEFT URETEROSCOPY ;  Surgeon: Ky Barban, MD;  Location: [facility];  Service: Urology;  Laterality: Left;  . CYSTOSCOPY WITH STENT PLACEMENT Right 12/29/2012   Procedure: CYSTOSCOPY WITH STENT PLACEMENT RIGHT URETER;  Surgeon: Ky Barban, MD;  Location: [facility];  Service: Urology;  Laterality: Right;    Family History: Family History  Problem Relation Age of Onset  . Migraines Father   . Skin cancer Maternal Grandmother   . Diabetes Maternal Grandmother   . High Cholesterol Maternal Grandmother     Social History: Social History   Tobacco Use  . Smoking status: Never Smoker  . Smokeless tobacco: Never Used   Substance Use Topics  . Alcohol use: No  . Drug use: No    Allergies:  Allergies  Allergen Reactions  . Imitrex [Sumatriptan Base] Anaphylaxis, Shortness Of Breath and Itching  . Sulfa Antibiotics Anaphylaxis  . Latex Hives  . Tramadol Hives    Meds:  No medications prior to admission.    ROS:  Review of Systems  Constitutional: Negative for chills, fatigue and fever.  Eyes: Negative for visual disturbance.  Respiratory: Negative for shortness of breath.   Cardiovascular: Negative for chest pain.  Gastrointestinal: Negative for abdominal pain, nausea and vomiting.  Genitourinary: Negative for difficulty urinating, dysuria, flank pain, pelvic pain, vaginal bleeding, vaginal discharge and vaginal pain.  Neurological: Negative for dizziness and headaches.  Psychiatric/Behavioral: Negative.      I have reviewed patient's Past Medical Hx, Surgical Hx, Family Hx, Social Hx, medications and allergies.   Physical Exam   Patient Vitals for the past 24 hrs:  BP Temp Temp src Pulse Resp SpO2 Height Weight  04/13/19 1309 140/80 98.7 F (37.1 C) Oral 98 18 100 % 5\' 5"  (1.651 m) 97 kg   Constitutional: Well-developed, well-nourished female in no acute distress.  Cardiovascular: normal rate Respiratory: normal effort GI: Abd soft, non-tender, gravid appropriate for gestational age.  MS: Extremities nontender, no edema, normal ROM Neurologic: Alert  and oriented x 4.  GU: Neg CVAT.  PELVIC EXAM: Cervix pink, visually closed, without lesion, scant white creamy discharge, vaginal walls and external genitalia normal Bimanual exam: Cervix 0/long/high, firm, anterior, neg CMT, uterus nontender, nonenlarged, adnexa without tenderness, enlargement, or mass     FHT:  135 by doppler   Labs: No results found for this or any previous visit (from the past 24 hour(s)). --/--/A NEG (08/03 1148)  Imaging:  No results found.  MAU Course/MDM: No orders of the defined types were placed  in this encounter.   Meds ordered this encounter  Medications  . betamethasone acetate-betamethasone sodium phosphate (CELESTONE) injection 12 mg     FHT obtained for triage pt I reviewed pt hx with Dr Sandford Craze and need for BMZ due to rising BP. Pt stable, no s/sx of PEC.   BMZ given by RN with second dose ordered tomorrow PEC precautions/reasons to return to MAU given Pt discharge with strict PEC precautions.   Assessment: 1. Gestational hypertension, third trimester   2. Blood type, Rh negative     Plan: Discharge home Labor precautions and fetal kick counts  Allergies as of 04/13/2019      Reactions   Imitrex [sumatriptan Base] Anaphylaxis, Shortness Of Breath, Itching   Sulfa Antibiotics Anaphylaxis   Latex Hives   Tramadol Hives      Medication List    ASK your doctor about these medications   cyclobenzaprine 10 MG tablet Commonly known as: FLEXERIL Take 10 mg by mouth 3 (three) times daily as needed for muscle spasms.   PrePLUS 27-1 MG Tabs Take 1 tablet by mouth daily.   promethazine 25 MG tablet Commonly known as: PHENERGAN Take 1 tablet (25 mg total) by mouth every 6 (six) hours as needed for nausea or vomiting.       Fatima Blank Certified Nurse-Midwife 04/13/2019 10:49 PM

## 2019-04-13 NOTE — MAU Note (Signed)
PT sent from office for BMTZ shot. Denies any headache, vision changes, URQ pain or increased swelling. FM+, denies LOF or VB.

## 2019-04-14 ENCOUNTER — Other Ambulatory Visit: Payer: Self-pay

## 2019-04-14 ENCOUNTER — Inpatient Hospital Stay (HOSPITAL_COMMUNITY)
Admission: AD | Admit: 2019-04-14 | Discharge: 2019-04-14 | Disposition: A | Discharge status: Home / Self Care | Payer: Medicaid Other | Attending: Obstetrics and Gynecology | Admitting: Obstetrics and Gynecology

## 2019-04-14 DIAGNOSIS — O133 Gestational [pregnancy-induced] hypertension without significant proteinuria, third trimester: Secondary | ICD-10-CM

## 2019-04-14 DIAGNOSIS — Z3A33 33 weeks gestation of pregnancy: Secondary | ICD-10-CM | POA: Diagnosis not present

## 2019-04-14 MED ORDER — BETAMETHASONE SOD PHOS & ACET 6 (3-3) MG/ML IJ SUSP
12.0000 mg | Freq: Once | INTRAMUSCULAR | Status: AC
  Administered 2019-04-14: 14:00:00 12 mg via INTRAMUSCULAR

## 2019-04-14 NOTE — MAU Note (Signed)
Pt here for BMZ injection, pt denies pain or any other complaints at this time.

## 2019-04-14 NOTE — MAU Provider Note (Addendum)
First Provider Initiated Contact with Patient 04/14/19 1421      S Ms. Holly Jacobs is a 24 y.o. G1P0 female at [redacted]w[redacted]d who presents to MAU today for 2nd BMZ injection. She was started on medications yesterday. She has no complaints today.   O BP 125/77 (BP Location: Right Arm)   Pulse 87   Resp 16   LMP 08/23/2018 Comment: nexplanon removed first week in July   Physical Exam  Nursing note and vitals reviewed. Constitutional: She is oriented to person, place, and time. She appears well-developed and well-nourished.  HENT:  Head: Normocephalic and atraumatic.  Eyes: Pupils are equal, round, and reactive to light.  Cardiovascular: Normal rate.  Respiratory: Effort normal.  GI: Soft.  Genitourinary:    Genitourinary Comments: Not indicated   Musculoskeletal:        General: Normal range of motion.     Cervical back: Normal range of motion.  Neurological: She is alert and oriented to person, place, and time.  Skin: Skin is warm and dry.  Psychiatric: She has a normal mood and affect. Her behavior is normal. Judgment and thought content normal.   FHTs by doppler: 140 bpm   A 24 yo G1P0 female at [redacted]w[redacted]d gestation Medical screening exam complete Gestational hypertension, third trimester   P Discharge from MAU in stable condition Reassurance given that VS are within normal range  Warning signs for worsening condition that would warrant emergency follow-up discussed Patient may return to MAU as needed for pregnancy related complaints  Raelyn Mora, CNM 04/14/2019 2:21 PM

## 2019-04-19 ENCOUNTER — Inpatient Hospital Stay (HOSPITAL_COMMUNITY)
Admission: AD | Admit: 2019-04-19 | Discharge: 2019-04-19 | Disposition: A | Discharge status: Home / Self Care | Payer: Medicaid Other | Attending: Obstetrics and Gynecology | Admitting: Obstetrics and Gynecology

## 2019-04-19 ENCOUNTER — Other Ambulatory Visit: Payer: Self-pay

## 2019-04-19 ENCOUNTER — Encounter (HOSPITAL_COMMUNITY): Payer: Self-pay | Admitting: Obstetrics and Gynecology

## 2019-04-19 ENCOUNTER — Inpatient Hospital Stay (HOSPITAL_BASED_OUTPATIENT_CLINIC_OR_DEPARTMENT_OTHER): Payer: Medicaid Other

## 2019-04-19 DIAGNOSIS — O133 Gestational [pregnancy-induced] hypertension without significant proteinuria, third trimester: Secondary | ICD-10-CM

## 2019-04-19 DIAGNOSIS — O139 Gestational [pregnancy-induced] hypertension without significant proteinuria, unspecified trimester: Secondary | ICD-10-CM

## 2019-04-19 DIAGNOSIS — Z3689 Encounter for other specified antenatal screening: Secondary | ICD-10-CM

## 2019-04-19 DIAGNOSIS — O289 Unspecified abnormal findings on antenatal screening of mother: Secondary | ICD-10-CM | POA: Diagnosis not present

## 2019-04-19 DIAGNOSIS — O10013 Pre-existing essential hypertension complicating pregnancy, third trimester: Secondary | ICD-10-CM | POA: Insufficient documentation

## 2019-04-19 DIAGNOSIS — Z3A34 34 weeks gestation of pregnancy: Secondary | ICD-10-CM

## 2019-04-19 NOTE — Discharge Instructions (Signed)
Biophysical Profile A biophysical profile is a non-invasive test that may be done during pregnancy to check that your developing baby (fetus) and your placenta are healthy. Your health care provider may recommend a biophysical profile if your pregnancy is at a higher risk for certain problems. A biophysical profile is usually done during the last 3 months of pregnancy (third trimester). A biophysical profile combines two tests to check the health of your baby. In one test, you will have a device strapped to your belly to measure your baby's heart rate. The other test involves using sound waves and a computer (ultrasound) to create an image of your baby inside your womb (uterus). Together, these tests tell your health care provider about the overall health of your baby. Tell a health care provider about:  Any allergies you have.  All medicines you are taking, including vitamins, herbs, eye drops, creams, and over-the-counter medicines.  Any medical conditions you have.  Any concerns you have about your pregnancy.  Any symptoms such as abdominal pain or contractions, nausea or vomiting, vaginal bleeding, leaking of amniotic fluid, decreased fetal movements, fever or infection, increased swelling, headaches, or visual disturbances.  How often you feel your baby move. What are the risks? There are no risks to you or your baby from a biophysical profile. What happens before the procedure? Ask your health care provider how to prepare.  You may need to drink fluids so that you have a full bladder for your ultrasound.  You may also need to eat before you arrive for the test. That makes your baby more active. What happens during the procedure?      You will lie on your back on an exam table.  Your blood pressure may be monitored during the procedure.  A belt will be placed around your belly. The belt has a sensor to measure your baby's heart rate.  You may have to wear another belt and  sensor to measure any muscle movements (contractions) in your uterus. During the ultrasound, a health care provider or technician will gently roll a handheld device (transducer) over your belly. This device sends signals to a computer that creates images of your baby.  Five areas of your baby's health and development will be checked during the biophysical profile: ? Heart rate. ? Breathing. ? Movement. ? Active muscle movement (muscle tone). ? The amount of fluid in your uterus (amniotic fluid). The procedure may vary among health care providers and hospitals. What happens after the procedure?  Your health care provider will discuss your results with you. The results of a biophysical profile are scored in a range of 0 to 10. Each area that is evaluated is given a score of 0 or 2 points. If you get a score of 6 or less, you may need further testing, or your baby may need to be delivered early. A score of 8 to 10 with normal amniotic fluid levels is considered normal.  Unless you need additional testing, you can go home right after the procedure and resume your normal activities. Summary  A biophysical profile is a non-invasive test that may be done during pregnancy to check that your developing baby (fetus) and your placenta are healthy.  A biophysical profile combines two tests: A test to measure your baby's heart rate and an ultrasound test to create an image of your baby in the womb.  Tell your health care provider about any concerns you have about your pregnancy or any pregnancy-related symptoms.  If you get a score of 6 or less, you may need further testing, or your baby may need to be delivered early. A score of 8 to 10 with normal amniotic fluid levels is considered normal. °This information is not intended to replace advice given to you by your health care provider. Make sure you discuss any questions you have with your health care provider. °Document Revised: 05/31/2018 Document  Reviewed: 03/12/2016 °Elsevier Patient Education © 2020 [employer] Inc[employer] ° °

## 2019-04-19 NOTE — MAU Provider Note (Signed)
History     CSN: [id number]  Arrival date and time: 04/19/19 1140   First Provider Initiated Contact with Patient 04/19/19 1242      Chief Complaint  Patient presents with  . Failed NST  . Hypertension   Holly Jacobs is a 24 y.o. G1P0 at [redacted]w[redacted]d who receives care at [facility].  She presents today for Failed NST and Hypertension.  She endorses that she was in the office today and that she failed her NST.  She reports that she has CHTN and takes Procardia daily and took her dose this morning.  She endorses fetal movement, but states she is not as active as she has been. She denies abdominal cramping or contractions as well as vaginal concerns.  She denies HA currently, but states she had one yesterday.  She also denies SOB, peripheral edema, and visual disturbances.       OB History    Gravida  1   Para      Term      Preterm      AB      Living        SAB      TAB      Ectopic      Multiple      Live Births              Past Medical History:  Diagnosis Date  . Depression   . Hypertension   . Migraine     Past Surgical History:  Procedure Laterality Date  . CYSTOSCOPY W/ URETERAL STENT REMOVAL Right 12/30/2012   Procedure: CYSTOSCOPY WITH STENT REMOVAL;  Surgeon: Ky Barban, MD;  Location: [facility];  Service: Urology;  Laterality: Right;  . CYSTOSCOPY WITH RETROGRADE PYELOGRAM, URETEROSCOPY AND STENT PLACEMENT Left 12/29/2012   Procedure: CYSTOSCOPY WITH LEFT RETROGRADE PYELOGRAM, BALLOON DILATION LEFT URETER; LEFT URETEROSCOPY ;  Surgeon: Ky Barban, MD;  Location: [facility];  Service: Urology;  Laterality: Left;  . CYSTOSCOPY WITH STENT PLACEMENT Right 12/29/2012   Procedure: CYSTOSCOPY WITH STENT PLACEMENT RIGHT URETER;  Surgeon: Ky Barban, MD;  Location: [facility];  Service: Urology;  Laterality: Right;    Family History  Problem Relation Age of Onset  . Migraines Father   . Skin cancer Maternal Grandmother   . Diabetes  Maternal Grandmother   . High Cholesterol Maternal Grandmother     Social History   Tobacco Use  . Smoking status: Never Smoker  . Smokeless tobacco: Never Used  Substance Use Topics  . Alcohol use: No  . Drug use: No    Allergies:  Allergies  Allergen Reactions  . Imitrex [Sumatriptan Base] Anaphylaxis, Shortness Of Breath and Itching  . Sulfa Antibiotics Anaphylaxis  . Latex Hives  . Tramadol Hives    Medications Prior to Admission  Medication Sig Dispense Refill Last Dose  . cyclobenzaprine (FLEXERIL) 10 MG tablet Take 10 mg by mouth 3 (three) times daily as needed for muscle spasms.   04/18/2019 at Unknown time  . NIFEdipine (PROCARDIA-XL/NIFEDICAL-XL) 30 MG 24 hr tablet Take 30 mg by mouth daily.   04/19/2019 at Unknown time  . Prenatal Vit-Fe Fumarate-FA (PREPLUS) 27-1 MG TABS Take 1 tablet by mouth daily.   04/19/2019 at Unknown time  . promethazine (PHENERGAN) 25 MG tablet Take 1 tablet (25 mg total) by mouth every 6 (six) hours as needed for nausea or vomiting. 30 tablet 0 04/18/2019 at Unknown time    Review of Systems  Constitutional: Negative for chills and fever.  Eyes: Negative for visual disturbance.  Respiratory: Negative for cough and shortness of breath.   Gastrointestinal: Positive for nausea. Negative for abdominal pain, constipation and diarrhea. Anal bleeding: This morning, none currently   Genitourinary: Negative for difficulty urinating, dysuria, vaginal bleeding and vaginal discharge.  Musculoskeletal: Negative for back pain.  Neurological: Negative for dizziness, light-headedness and headaches.   Physical Exam   Blood pressure (!) 135/92, pulse (!) 104, temperature 98.2 F (36.8 C), temperature source Oral, resp. rate 18, last menstrual period 08/23/2018, SpO2 99 %.  Vitals:   04/19/19 1245 04/19/19 1300  BP: (!) 143/86 126/72  Pulse: 95 97  Resp:    Temp:    SpO2: 99% 98%    Physical Exam  Constitutional: She is oriented to person, place,  and time. She appears well-developed and well-nourished. No distress.  HENT:  Head: Normocephalic and atraumatic.  Eyes: Conjunctivae are normal.  Cardiovascular: Normal rate, regular rhythm and normal heart sounds.  Respiratory: Effort normal and breath sounds normal.  GI: Soft. There is no abdominal tenderness.  Musculoskeletal:        General: Edema (+1 pitting ) present. Normal range of motion.     Cervical back: Normal range of motion.  Neurological: She is alert and oriented to person, place, and time.  Skin: Skin is warm and dry.    Fetal Assessment 135 bpm, Mod Var, -Decels, +Accels Toco:None graphed  MAU Course     MDM PE Labs: None EFM Ultrasound Assessment and Plan  24 year old G1P0  SIUP at 34.1weeks Cat I FT Failed NST in Office cHTN on Meds  -Exam findings discussed. -Informed that baby now reactive on monitor. -Discussed BPP including what it identifies and why it is done.  -Will send for BPP  Maryann Conners MSN, CNM 04/19/2019, 12:42 PM    Reassessment (1:41 PM) -BPP returns 8/8 -Overall fetal testing 8/10 -Patient updated on results. -Informed that she would be discharged -Labor and PreEclampsia Precautions Given. -Encouraged to call or return to MAU if symptoms worsen or with the onset of new symptoms. -Discharged to home in stable condition.  Maryann Conners MSN, CNM Advanced Practice Provider, [facility]

## 2019-04-19 NOTE — MAU Note (Signed)
Pt presents to MAU from OB office due to failed weekly NST. She also had elevated BP while there (150s/100s). Pt denies HA, Visual changes, epigastric pain but has had increased swelling in feet. Pt denies VB and LOF.

## 2019-04-23 ENCOUNTER — Other Ambulatory Visit: Payer: Self-pay

## 2019-04-23 ENCOUNTER — Encounter (HOSPITAL_COMMUNITY): Payer: Self-pay | Admitting: Obstetrics and Gynecology

## 2019-04-23 ENCOUNTER — Observation Stay (HOSPITAL_COMMUNITY)
Admission: AD | Admit: 2019-04-23 | Discharge: 2019-04-25 | Disposition: A | Discharge status: Home / Self Care | Payer: Medicaid Other | Attending: Obstetrics and Gynecology | Admitting: Obstetrics and Gynecology

## 2019-04-23 DIAGNOSIS — O133 Gestational [pregnancy-induced] hypertension without significant proteinuria, third trimester: Secondary | ICD-10-CM | POA: Diagnosis present

## 2019-04-23 DIAGNOSIS — Z3A34 34 weeks gestation of pregnancy: Secondary | ICD-10-CM

## 2019-04-23 DIAGNOSIS — R519 Headache, unspecified: Secondary | ICD-10-CM | POA: Insufficient documentation

## 2019-04-23 DIAGNOSIS — O10013 Pre-existing essential hypertension complicating pregnancy, third trimester: Secondary | ICD-10-CM | POA: Diagnosis not present

## 2019-04-23 DIAGNOSIS — Z3A35 35 weeks gestation of pregnancy: Secondary | ICD-10-CM | POA: Diagnosis not present

## 2019-04-23 DIAGNOSIS — O360930 Maternal care for other rhesus isoimmunization, third trimester, not applicable or unspecified: Secondary | ICD-10-CM

## 2019-04-23 DIAGNOSIS — Z6791 Unspecified blood type, Rh negative: Secondary | ICD-10-CM

## 2019-04-23 LAB — COMPREHENSIVE METABOLIC PANEL
ALT: 16 U/L (ref 0–44)
AST: 20 U/L (ref 15–41)
Albumin: 2.7 g/dL — ABNORMAL LOW (ref 3.5–5.0)
Alkaline Phosphatase: 111 U/L (ref 38–126)
Anion gap: 10 (ref 5–15)
BUN: 7 mg/dL (ref 6–20)
CO2: 21 mmol/L — ABNORMAL LOW (ref 22–32)
Calcium: 9.1 mg/dL (ref 8.9–10.3)
Chloride: 105 mmol/L (ref 98–111)
Creatinine, Ser: 0.6 mg/dL (ref 0.44–1.00)
GFR calc Af Amer: >60 mL/min (ref >60)
GFR calc non Af Amer: >60 mL/min (ref >60)
Glucose, Bld: 81 mg/dL (ref 70–99)
Potassium: 3.8 mmol/L (ref 3.5–5.1)
Sodium: 136 mmol/L (ref 135–145)
Total Bilirubin: 0.6 mg/dL (ref 0.3–1.2)
Total Protein: 5.7 g/dL — ABNORMAL LOW (ref 6.5–8.1)

## 2019-04-23 LAB — TYPE AND SCREEN
ABO/RH(D): A NEG
Antibody Screen: POSITIVE

## 2019-04-23 LAB — CBC
HCT: 35.7 % — ABNORMAL LOW (ref 36.0–46.0)
Hemoglobin: 12 g/dL (ref 12.0–15.0)
MCH: 30.5 pg (ref 26.0–34.0)
MCHC: 33.6 g/dL (ref 30.0–36.0)
MCV: 90.6 fL (ref 80.0–100.0)
Platelets: 291 10*3/uL (ref 150–400)
RBC: 3.94 MIL/uL (ref 3.87–5.11)
RDW: 13 % (ref 11.5–15.5)
WBC: 10.9 10*3/uL — ABNORMAL HIGH (ref 4.0–10.5)
nRBC: 0 % (ref 0.0–0.2)

## 2019-04-23 LAB — PROTEIN / CREATININE RATIO, URINE
Creatinine, Urine: 26.38 mg/dL
Total Protein, Urine: <6 mg/dL

## 2019-04-23 MED ORDER — LABETALOL HCL 5 MG/ML IV SOLN: 40.0000 mg | INTRAVENOUS | Status: DC | PRN

## 2019-04-23 MED ORDER — LABETALOL HCL 5 MG/ML IV SOLN: 80.0000 mg | INTRAVENOUS | Status: DC | PRN

## 2019-04-23 MED ORDER — BETAMETHASONE SOD PHOS & ACET 6 (3-3) MG/ML IJ SUSP: 12.0000 mg | Freq: Once | INTRAMUSCULAR | Status: DC

## 2019-04-23 MED ORDER — LACTATED RINGERS IV SOLN: INTRAVENOUS | Status: DC

## 2019-04-23 MED ORDER — CALCIUM CARBONATE ANTACID 500 MG PO CHEW
2.0000 | CHEWABLE_TABLET | ORAL | Status: DC | PRN
  Administered 2019-04-24: 400 mg via ORAL
  Filled 2019-04-23: qty 2

## 2019-04-23 MED ORDER — ZOLPIDEM TARTRATE 5 MG PO TABS
5.0000 mg | ORAL_TABLET | Freq: Every evening | ORAL | Status: DC | PRN
  Administered 2019-04-23 – 2019-04-24 (×2): 5 mg via ORAL
  Filled 2019-04-23 (×2): qty 1

## 2019-04-23 MED ORDER — HYDRALAZINE HCL 20 MG/ML IJ SOLN: 10.0000 mg | INTRAMUSCULAR | Status: DC | PRN

## 2019-04-23 MED ORDER — ACETAMINOPHEN 325 MG PO TABS
650.0000 mg | ORAL_TABLET | ORAL | Status: DC | PRN
  Administered 2019-04-23 (×2): 650 mg via ORAL
  Filled 2019-04-23 (×2): qty 2

## 2019-04-23 MED ORDER — NIFEDIPINE ER OSMOTIC RELEASE 30 MG PO TB24
60.0000 mg | ORAL_TABLET | Freq: Once | ORAL | Status: AC
  Administered 2019-04-23: 60 mg via ORAL
  Filled 2019-04-23: qty 2

## 2019-04-23 MED ORDER — LABETALOL HCL 5 MG/ML IV SOLN: 20.0000 mg | INTRAVENOUS | Status: DC | PRN

## 2019-04-23 MED ORDER — PRENATAL MULTIVITAMIN CH
1.0000 | ORAL_TABLET | Freq: Every day | ORAL | Status: DC
  Administered 2019-04-24: 1 via ORAL
  Filled 2019-04-23: qty 1

## 2019-04-23 MED ORDER — DOCUSATE SODIUM 100 MG PO CAPS
100.0000 mg | ORAL_CAPSULE | Freq: Every day | ORAL | Status: DC
  Administered 2019-04-24 – 2019-04-25 (×2): 100 mg via ORAL
  Filled 2019-04-23 (×2): qty 1

## 2019-04-23 MED ORDER — LABETALOL HCL 5 MG/ML IV SOLN
20.0000 mg | INTRAVENOUS | Status: DC | PRN
  Administered 2019-04-23: 20 mg via INTRAVENOUS
  Filled 2019-04-23: qty 4

## 2019-04-23 NOTE — MAU Provider Note (Signed)
History     CSN: [id number]  Arrival date and time: 04/23/19 1250   First Provider Initiated Contact with Patient 04/23/19 1339      Chief Complaint  Patient presents with  . Hypertension   HPI  Ms.  Holly Jacobs is a 24 y.o. year old [id number] female at [redacted]w[redacted]d weeks gestation who was sent to MAU from [facility] for severe range BPs on Procardia 30 mg daily; she had today's dose. Per Dr. Ellyn Hack the BP in the office was 142/100 and the patient had a BP of 170/98 at home. The patient reports having a H/A last night, but it was relieved with Tylenol. She reports occasionally seeing floaters, but none today. She received BMZ on 04/13/19 and 04/14/19. She reports some "mild cramping like a period."   Past Medical History:  Diagnosis Date  . Depression   . Hypertension   . Migraine     Past Surgical History:  Procedure Laterality Date  . CYSTOSCOPY W/ URETERAL STENT REMOVAL Right 12/30/2012   Procedure: CYSTOSCOPY WITH STENT REMOVAL;  Surgeon: Ky Barban, MD;  Location: [facility];  Service: Urology;  Laterality: Right;  . CYSTOSCOPY WITH RETROGRADE PYELOGRAM, URETEROSCOPY AND STENT PLACEMENT Left 12/29/2012   Procedure: CYSTOSCOPY WITH LEFT RETROGRADE PYELOGRAM, BALLOON DILATION LEFT URETER; LEFT URETEROSCOPY ;  Surgeon: Ky Barban, MD;  Location: [facility];  Service: Urology;  Laterality: Left;  . CYSTOSCOPY WITH STENT PLACEMENT Right 12/29/2012   Procedure: CYSTOSCOPY WITH STENT PLACEMENT RIGHT URETER;  Surgeon: Ky Barban, MD;  Location: [facility];  Service: Urology;  Laterality: Right;    Family History  Problem Relation Age of Onset  . Migraines Father   . Skin cancer Maternal Grandmother   . Diabetes Maternal Grandmother   . High Cholesterol Maternal Grandmother     Social History   Tobacco Use  . Smoking status: Never Smoker  . Smokeless tobacco: Never Used  Substance Use Topics  . Alcohol use: No  . Drug use: No    Allergies:   Allergies  Allergen Reactions  . Imitrex [Sumatriptan Base] Anaphylaxis, Shortness Of Breath and Itching  . Sulfa Antibiotics Anaphylaxis  . Latex Hives  . Tramadol Hives    Medications Prior to Admission  Medication Sig Dispense Refill Last Dose  . cyclobenzaprine (FLEXERIL) 10 MG tablet Take 10 mg by mouth 3 (three) times daily as needed for muscle spasms.   04/22/2019 at Unknown time  . ferrous sulfate 325 (65 FE) MG tablet Take 325 mg by mouth every other day.     Marland Kitchen NIFEdipine (PROCARDIA-XL/NIFEDICAL-XL) 30 MG 24 hr tablet Take 30 mg by mouth daily.   04/23/2019 at Unknown time  . Prenatal Vit-Fe Fumarate-FA (PREPLUS) 27-1 MG TABS Take 1 tablet by mouth daily.   04/23/2019 at Unknown time  . promethazine (PHENERGAN) 25 MG tablet Take 1 tablet (25 mg total) by mouth every 6 (six) hours as needed for nausea or vomiting. 30 tablet 0 04/22/2019 at Unknown time    Review of Systems  Constitutional: Negative.   HENT: Negative.   Eyes: Negative.   Respiratory: Negative.   Cardiovascular: Negative.   Gastrointestinal: Positive for abdominal pain (mild cramping).  Endocrine: Negative.   Genitourinary: Negative.   Musculoskeletal: Negative.   Skin: Negative.   Allergic/Immunologic: Negative.   Neurological: Negative.   Hematological: Negative.   Psychiatric/Behavioral: Negative.    Physical Exam   Blood pressure (!) 144/84, pulse 77, temperature 98.2 F (36.8 C), temperature  source Oral, resp. rate 18, last menstrual period 08/23/2018, SpO2 97 %.  Physical Exam  Nursing note and vitals reviewed. Constitutional: She is oriented to person, place, and time. She appears well-developed and well-nourished.  HENT:  Head: Normocephalic and atraumatic.  Eyes: Pupils are equal, round, and reactive to light.  Cardiovascular: Normal rate and regular rhythm.  Respiratory: Effort normal.  GI: Soft.  Genitourinary:    Genitourinary Comments: deferred   Musculoskeletal:        General: Normal  range of motion.     Cervical back: Normal range of motion.  Neurological: She is alert and oriented to person, place, and time. She has normal reflexes.  Brisk DTRs, no beats of clonus  Skin: Skin is warm and dry.  Psychiatric: She has a normal mood and affect. Her behavior is normal. Judgment and thought content normal.    MAU Course  Procedures  MDM CCUA CBC CMP P/C Ratio Serial BP's  NST - FHR: 135 bpm / moderate variability / accels present / decels absent / TOCO: UI with occ UC's noted   Results for orders placed or performed during the hospital encounter of 04/23/19 (from the past 24 hour(s))  Protein / creatinine ratio, urine     Status: None   Collection Time: 04/23/19  1:18 PM  Result Value Ref Range   Creatinine, Urine 26.38 mg/dL   Total Protein, Urine <6 mg/dL   Protein Creatinine Ratio        0.00 - 0.15 mg/mg[Cre]  CBC     Status: Abnormal   Collection Time: 04/23/19  1:23 PM  Result Value Ref Range   WBC 10.9 (H) 4.0 - 10.5 K/uL   RBC 3.94 3.87 - 5.11 MIL/uL   Hemoglobin 12.0 12.0 - 15.0 g/dL   HCT 62.6 (L) 94.8 - 54.6 %   MCV 90.6 80.0 - 100.0 fL   MCH 30.5 26.0 - 34.0 pg   MCHC 33.6 30.0 - 36.0 g/dL   RDW 27.0 35.0 - 09.3 %   Platelets 291 150 - 400 K/uL   nRBC 0.0 0.0 - 0.2 %  Comprehensive metabolic panel     Status: Abnormal   Collection Time: 04/23/19  1:23 PM  Result Value Ref Range   Sodium 136 135 - 145 mmol/L   Potassium 3.8 3.5 - 5.1 mmol/L   Chloride 105 98 - 111 mmol/L   CO2 21 (L) 22 - 32 mmol/L   Glucose, Bld 81 70 - 99 mg/dL   BUN 7 6 - 20 mg/dL   Creatinine, Ser 8.18 0.44 - 1.00 mg/dL   Calcium 9.1 8.9 - 29.9 mg/dL   Total Protein 5.7 (L) 6.5 - 8.1 g/dL   Albumin 2.7 (L) 3.5 - 5.0 g/dL   AST 20 15 - 41 U/L   ALT 16 0 - 44 U/L   Alkaline Phosphatase 111 38 - 126 U/L   Total Bilirubin 0.6 0.3 - 1.2 mg/dL   GFR calc non Af Amer >60 >60 mL/min   GFR calc Af Amer >60 >60 mL/min   Anion gap 10 5 - 15  Type and screen [facility]     Status: None (Preliminary result)   Collection Time: 04/23/19  1:23 PM  Result Value Ref Range   ABO/RH(D) PENDING    Antibody Screen PENDING    Sample Expiration      04/26/2019,2359 Performed at [facility], [address] [place], [place] [address]      *  Consult with Dr. Harolyn Rutherford @ 1444 - notified of patient's complaints, assessments, lab & NST results, recommended tx plan admission for observation & BMZ, if not already done *Consult with Dr. Willis Modena @ [phone] - notified of patient's complaints, assessments, lab, NST results, and Dr. Arther Abbott recommended tx plan - agrees with plan to admit; will place admission orders   Assessment and Plan  Gestational hypertension, third trimester - Admit to [facility] - Dr. Willis Modena will enter admission orders in computer - [place] [facility] IVFs - [facility]  - Dr. Willis Modena assumes care of the patient at [place], MSN, CNM 04/23/2019, 2:55 PM

## 2019-04-23 NOTE — MAU Note (Signed)
Pt was seen in office today for NST and BP check.  BP was elevated in office and baby failed NST.  Pt denies HA, visual changes, or epigastric pain.  Reports good fetal movement.

## 2019-04-23 NOTE — H&P (Signed)
Ms. Holly Jacobs is a 24 y.o. year old G1P0 at [redacted]w[redacted]d weeks gestation who was sent to MAU from the office for severe range BPs on Procardia 30 mg daily; she had today's dose. Per Dr. Melba Coon the BP in the office was 142/100 and the patient had a BP of 170/98 at home. The patient reports having a H/A last night, but it was relieved with Tylenol. She reports occasionally seeing floaters, but none today. She received BMZ on 04/13/19 and 04/14/19 when she was initially diagnosed with PIH. She reports some "mild cramping like a period."       Past Medical History:  Diagnosis Date  . Depression   . Hypertension   . Migraine         Past Surgical History:  Procedure Laterality Date  . CYSTOSCOPY W/ URETERAL STENT REMOVAL Right 12/30/2012   Procedure: CYSTOSCOPY WITH STENT REMOVAL; Surgeon: Marissa Nestle, MD; Location: [facility]; Service: Urology; Laterality: Right;  . CYSTOSCOPY WITH RETROGRADE PYELOGRAM, URETEROSCOPY AND STENT PLACEMENT Left 12/29/2012   Procedure: CYSTOSCOPY WITH LEFT RETROGRADE PYELOGRAM, BALLOON DILATION LEFT URETER; LEFT URETEROSCOPY ; Surgeon: Marissa Nestle, MD; Location: [facility]; Service: Urology; Laterality: Left;  . CYSTOSCOPY WITH STENT PLACEMENT Right 12/29/2012   Procedure: CYSTOSCOPY WITH STENT PLACEMENT RIGHT URETER; Surgeon: Marissa Nestle, MD; Location: [facility]; Service: Urology; Laterality: Right;        Family History  Problem Relation Age of Onset  . Migraines Father   . Skin cancer Maternal Grandmother   . Diabetes Maternal Grandmother   . High Cholesterol Maternal Grandmother    Social History       Tobacco Use  . Smoking status: Never Smoker  . Smokeless tobacco: Never Used  Substance Use Topics  . Alcohol use: No  . Drug use: No   Allergies:      Allergies  Allergen Reactions  . Imitrex [Sumatriptan Base] Anaphylaxis, Shortness Of Breath and Itching  . Sulfa Antibiotics Anaphylaxis  . Latex Hives  . Tramadol Hives           Medications Prior to Admission  Medication Sig Dispense Refill Last Dose  . cyclobenzaprine (FLEXERIL) 10 MG tablet Take 10 mg by mouth 3 (three) times daily as needed for muscle spasms.   04/22/2019 at Unknown time  . ferrous sulfate 325 (65 FE) MG tablet Take 325 mg by mouth every other day.     Marland Kitchen NIFEdipine (PROCARDIA-XL/NIFEDICAL-XL) 30 MG 24 hr tablet Take 30 mg by mouth daily.   04/23/2019 at Unknown time  . Prenatal Vit-Fe Fumarate-FA (PREPLUS) 27-1 MG TABS Take 1 tablet by mouth daily.   04/23/2019 at Unknown time  . promethazine (PHENERGAN) 25 MG tablet Take 1 tablet (25 mg total) by mouth every 6 (six) hours as needed for nausea or vomiting. 30 tablet 0 04/22/2019 at Unknown time   Review of Systems-per CNM Constitutional: Negative.  HENT: Negative.  Eyes: Negative.  Respiratory: Negative.  Cardiovascular: Negative.  Gastrointestinal: Positive for abdominal pain (mild cramping).  Endocrine: Negative.  Genitourinary: Negative.  Musculoskeletal: Negative.  Skin: Negative.  Allergic/Immunologic: Negative.  Neurological: Negative.  Hematological: Negative.  Psychiatric/Behavioral: Negative.   Physical Exam-per CNM  Blood pressure (!) 144/84, pulse 77, temperature 98.2 F (36.8 C), temperature source Oral, resp. rate 18, last menstrual period 08/23/2018, SpO2 97 %.  Physical Exam  Nursing note and vitals reviewed.  Constitutional: She is oriented to person, place, and time. She appears well-developed and well-nourished.  HENT:  Head:  Normocephalic and atraumatic.  Eyes: Pupils are equal, round, and reactive to light.  Cardiovascular: Normal rate and regular rhythm.  Respiratory: Effort normal.  GI: Soft.  Genitourinary: Genitourinary Comments: deferred  Musculoskeletal:  General: Normal range of motion.  Cervical back: Normal range of motion.  Neurological: She is alert and oriented to person, place, and time. She has normal reflexes.  Brisk DTRs, no beats of clonus  Skin:  Skin is warm and dry.  Psychiatric: She has a normal mood and affect. Her behavior is normal. Judgment and thought content normal.   A/P:  IUP at [redacted]W[redacted]D with PIH with severe range BP in MAU requiring IV Labetalol.  Labs are normal, no proteinuria.  She received betamethasone less than 2 weeks ago.  Will admit for observation, increase Procardia to 60 mg daily, monitor BP and treat if needed, monitor for worsening of clinical picture.  Discussed that we would deliver at 37 weeks unless clinical picture worsened requiring delivery prior to that

## 2019-04-24 MED ORDER — METOCLOPRAMIDE HCL 5 MG/ML IJ SOLN
10.0000 mg | Freq: Three times a day (TID) | INTRAMUSCULAR | Status: DC | PRN
  Administered 2019-04-24: 10 mg via INTRAVENOUS
  Filled 2019-04-24: qty 2

## 2019-04-24 MED ORDER — NIFEDIPINE ER OSMOTIC RELEASE 30 MG PO TB24
60.0000 mg | ORAL_TABLET | Freq: Every day | ORAL | Status: DC
  Administered 2019-04-24 – 2019-04-25 (×2): 60 mg via ORAL
  Filled 2019-04-24 (×2): qty 2

## 2019-04-24 MED ORDER — METOCLOPRAMIDE HCL 10 MG PO TABS: 10.0000 mg | ORAL_TABLET | Freq: Three times a day (TID) | ORAL | Status: DC | PRN

## 2019-04-24 MED ORDER — BUTALBITAL-APAP-CAFFEINE 50-325-40 MG PO TABS
1.0000 | ORAL_TABLET | ORAL | Status: DC | PRN
  Administered 2019-04-24 (×3): 1 via ORAL
  Filled 2019-04-24 (×4): qty 1

## 2019-04-24 NOTE — Plan of Care (Signed)
  Problem: Coping: Goal: Level of anxiety will decrease Outcome: Progressing   Problem: Education: Goal: Knowledge of disease or condition will improve Outcome: Progressing Goal: Knowledge of the prescribed therapeutic regimen will improve Outcome: Progressing Note: Procardia dose increased from 30mg  to 60mg  this shift. Will con't to monitor BP; labs to be repeated in the am.   Problem: Clinical Measurements: Goal: Complications related to disease process, condition or treatment will be avoided or minimized Outcome: Progressing

## 2019-04-24 NOTE — Progress Notes (Addendum)
Patient ID: Holly Jacobs, female   DOB: 1995-05-31, 24 y.o.   MRN: [id number]  PIH 34+6  +FM, no LOF, no VB, occ ctx; some HA overnight  AF 144-162/82-103 (155/88)  gen NAD Fundus gravid, NT FHTs 134-140, mod var, category 1 toco irr  Will make sure gets procardia XL 60 q day; monitor BP closely Some severe range BP, no proteinuria.   HA overnight.  Plan for delivery at 37 weeks

## 2019-04-24 NOTE — Progress Notes (Signed)
Patient ID: HISAKO BUGH, female   DOB: [birth year], 24 y.o.   MRN: [id number]   PIH/?CHTN 34+6  +FM, no LOF, no VB< occ ctx.  Some low intensity HA today, but increased procardia dose today.  BP better controlled, mostly 140-150/90's w increased procardia.  D.W pt POC - monitoring for Pre E sx's, possibility of delivery.  Would like to deliver at 37wks.  Pt w/o proteinuria.  Will check labs in AM. Had more symptoms overnight, want to watch overnight tonight.  AF VSS (140-156/80-99) gen NAD  FHTs 140's, mod var, + accels, category 1 toco occ, pt not often appreciating  D/W pt plan for IOL 3/18 if not pushed before that  Will check labs in AM, monitor thru night.  If HA improved may d/c home

## 2019-04-25 LAB — COMPREHENSIVE METABOLIC PANEL
ALT: 16 U/L (ref 0–44)
AST: 18 U/L (ref 15–41)
Albumin: 2.7 g/dL — ABNORMAL LOW (ref 3.5–5.0)
Alkaline Phosphatase: 112 U/L (ref 38–126)
Anion gap: 10 (ref 5–15)
BUN: 9 mg/dL (ref 6–20)
CO2: 22 mmol/L (ref 22–32)
Calcium: 8.9 mg/dL (ref 8.9–10.3)
Chloride: 103 mmol/L (ref 98–111)
Creatinine, Ser: 0.55 mg/dL (ref 0.44–1.00)
GFR calc Af Amer: >60 mL/min (ref >60)
GFR calc non Af Amer: >60 mL/min (ref >60)
Glucose, Bld: 79 mg/dL (ref 70–99)
Potassium: 3.5 mmol/L (ref 3.5–5.1)
Sodium: 135 mmol/L (ref 135–145)
Total Bilirubin: 0.4 mg/dL (ref 0.3–1.2)
Total Protein: 5.7 g/dL — ABNORMAL LOW (ref 6.5–8.1)

## 2019-04-25 LAB — CBC
HCT: 35.1 % — ABNORMAL LOW (ref 36.0–46.0)
Hemoglobin: 11.4 g/dL — ABNORMAL LOW (ref 12.0–15.0)
MCH: 29.7 pg (ref 26.0–34.0)
MCHC: 32.5 g/dL (ref 30.0–36.0)
MCV: 91.4 fL (ref 80.0–100.0)
Platelets: 278 10*3/uL (ref 150–400)
RBC: 3.84 MIL/uL — ABNORMAL LOW (ref 3.87–5.11)
RDW: 13.2 % (ref 11.5–15.5)
WBC: 9.6 10*3/uL (ref 4.0–10.5)
nRBC: 0 % (ref 0.0–0.2)

## 2019-04-25 LAB — PROTEIN / CREATININE RATIO, URINE
Creatinine, Urine: 118.32 mg/dL
Protein Creatinine Ratio: 0.14 mg/mg{Cre} (ref 0.00–0.15)
Total Protein, Urine: 17 mg/dL

## 2019-04-25 MED ORDER — ASPIRIN EC 81 MG PO TBEC: 81.0000 mg | DELAYED_RELEASE_TABLET | Freq: Every day | ORAL | 1 refills | Status: DC

## 2019-04-25 MED ORDER — NIFEDIPINE ER 60 MG PO TB24: 60.0000 mg | ORAL_TABLET | Freq: Every day | ORAL | 1 refills | Status: DC

## 2019-04-25 NOTE — Progress Notes (Signed)
Patient ID: Holly Jacobs, female   DOB: 04-24-1995, 24 y.o.   MRN: [id number] Pt doing well today. HA improved. Denies CP, SOB or blurry vision. She is appreciating FMs. No contractions. VS - 128-138/77-81, 96 EFM - Cat 1, 140 TOCO - no contractions SVE - deferred  Labs : ALT-16, AST - 18, plts 278, upr/cr 0.14  A/P: Prime at 35 0/7kws with chronic hypertension currently controlled with procardia 60xl daily; cat 1        - Pt ok for discharge to home today with plan for outpatient follow up in two days for NST/BP check in office         - Recommend adding baby asa daily         - Preeclampsia precautions reviewed

## 2019-04-25 NOTE — Discharge Summary (Signed)
OB Discharge Summary     Patient Name: Holly Jacobs DOB: 05/15/1995 MRN: [id number]  Date of admission: 04/23/2019 Delivering MD: This patient has no babies on file.  Date of discharge: 04/25/2019  Admitting diagnosis: PIH (pregnancy induced hypertension), third trimester [O13.3] Intrauterine pregnancy: [redacted]w[redacted]d     Secondary diagnosis:  Active Problems:   PIH (pregnancy induced hypertension), third trimester  Additional problems: none     Discharge diagnosis: chronic hypertension                                                                                                 Hospital course:  Pt admitted for blood pressure control. Procardia increased from 30 to 60mg  xl daily with good response. Pt is s/p BMZ. Pt stable for discharge to home today with close interval follow up in office in two days   Physical exam  Vitals:   04/24/19 2339 04/24/19 2340 04/25/19 0410 04/25/19 0913  BP: (!) 156/84  128/77 138/81  Pulse: 95  94 96  Resp: 18  18 18   Temp: 98.5 F (36.9 C)  97.7 F (36.5 C) 98 F (36.7 C)  TempSrc: Oral  Oral Oral  SpO2: 100% 99% 100% 99%  Weight:      Height:       General: alert, cooperative and no distress Lochia: appropriate Uterine Fundus: soft Incision: N/A DVT Evaluation: No evidence of DVT seen on physical exam. Labs: Lab Results  Component Value Date   WBC 9.6 04/25/2019   HGB 11.4 (L) 04/25/2019   HCT 35.1 (L) 04/25/2019   MCV 91.4 04/25/2019   PLT 278 04/25/2019   CMP Latest Ref Rng & Units 04/25/2019  Glucose 70 - 99 mg/dL 79  BUN 6 - 20 mg/dL 9  Creatinine 06/25/2019 - 06/25/2019 mg/dL 6.27  Sodium 0.35 - 0.09 mmol/L 135  Potassium 3.5 - 5.1 mmol/L 3.5  Chloride 98 - 111 mmol/L 103  CO2 22 - 32 mmol/L 22  Calcium 8.9 - 10.3 mg/dL 8.9  Total Protein 6.5 - 8.1 g/dL 381)  Total Bilirubin 0.3 - 1.2 mg/dL 0.4  Alkaline Phos 38 - 126 U/L 112  AST 15 - 41 U/L 18  ALT 0 - 44 U/L 16    Discharge instruction: per After Visit Summary and "Baby  and Me Booklet".  After visit meds:  Allergies as of 04/25/2019      Reactions   Imitrex [sumatriptan Base] Anaphylaxis, Shortness Of Breath, Itching   Sulfa Antibiotics Anaphylaxis   Latex Hives   Tramadol Hives      Medication List    TAKE these medications   acetaminophen 500 MG tablet Commonly known as: TYLENOL Take 1,000 mg by mouth every 6 (six) hours as needed for mild pain or headache.   aspirin EC 81 MG tablet Take 1 tablet (81 mg total) by mouth daily.   cyclobenzaprine 10 MG tablet Commonly known as: FLEXERIL Take 10 mg by mouth 3 (three) times daily as needed for muscle spasms.   ferrous sulfate 325 (65 FE) MG tablet Take 325 mg by mouth every other  day.   NIFEdipine 60 MG 24 hr tablet Commonly known as: ADALAT CC Take 1 tablet (60 mg total) by mouth daily. What changed:   medication strength  how much to take   prenatal multivitamin Tabs tablet Take 1 tablet by mouth daily at 12 noon.   promethazine 25 MG tablet Commonly known as: PHENERGAN Take 1 tablet (25 mg total) by mouth every 6 (six) hours as needed for nausea or vomiting.       Diet: low salt diet  Activity: Advance as tolerated. Pelvic rest for 6 weeks.   Outpatient follow up:2 days Follow up Appt: Future Appointments  Date Time Provider [place]  05/30/2019 12:00 AM [facility] [place] None   Follow up Visit:No follow-ups on file.    Newborn Data: This patient has no babies on file. Disposition:in utero   04/25/2019 Isaiah Serge, DO

## 2019-04-25 NOTE — Discharge Instructions (Signed)
Call office with any concerns [phone] 

## 2019-04-30 ENCOUNTER — Encounter (HOSPITAL_COMMUNITY): Payer: Self-pay | Admitting: Obstetrics and Gynecology

## 2019-04-30 ENCOUNTER — Inpatient Hospital Stay (EMERGENCY_DEPARTMENT_HOSPITAL)
Admission: AD | Admit: 2019-04-30 | Discharge: 2019-04-30 | Disposition: A | Discharge status: Home / Self Care | Payer: Medicaid Other | Source: Home / Self Care | Attending: Obstetrics and Gynecology | Admitting: Obstetrics and Gynecology

## 2019-04-30 ENCOUNTER — Inpatient Hospital Stay (HOSPITAL_COMMUNITY)
Admission: AD | Admit: 2019-04-30 | Discharge: 2019-05-04 | DRG: 807 | Disposition: A | Discharge status: Home / Self Care | Payer: Medicaid Other | Attending: Obstetrics and Gynecology | Admitting: Obstetrics and Gynecology

## 2019-04-30 ENCOUNTER — Other Ambulatory Visit: Payer: Self-pay

## 2019-04-30 DIAGNOSIS — L918 Other hypertrophic disorders of the skin: Secondary | ICD-10-CM | POA: Diagnosis present

## 2019-04-30 DIAGNOSIS — Z3A35 35 weeks gestation of pregnancy: Secondary | ICD-10-CM

## 2019-04-30 DIAGNOSIS — O134 Gestational [pregnancy-induced] hypertension without significant proteinuria, complicating childbirth: Principal | ICD-10-CM | POA: Diagnosis present

## 2019-04-30 DIAGNOSIS — Z885 Allergy status to narcotic agent status: Secondary | ICD-10-CM | POA: Insufficient documentation

## 2019-04-30 DIAGNOSIS — O9081 Anemia of the puerperium: Secondary | ICD-10-CM | POA: Diagnosis not present

## 2019-04-30 DIAGNOSIS — O10919 Unspecified pre-existing hypertension complicating pregnancy, unspecified trimester: Secondary | ICD-10-CM

## 2019-04-30 DIAGNOSIS — Z6791 Unspecified blood type, Rh negative: Secondary | ICD-10-CM | POA: Diagnosis not present

## 2019-04-30 DIAGNOSIS — Z888 Allergy status to other drugs, medicaments and biological substances status: Secondary | ICD-10-CM | POA: Insufficient documentation

## 2019-04-30 DIAGNOSIS — Z20822 Contact with and (suspected) exposure to covid-19: Secondary | ICD-10-CM | POA: Diagnosis present

## 2019-04-30 DIAGNOSIS — O10913 Unspecified pre-existing hypertension complicating pregnancy, third trimester: Secondary | ICD-10-CM

## 2019-04-30 DIAGNOSIS — O26893 Other specified pregnancy related conditions, third trimester: Secondary | ICD-10-CM | POA: Diagnosis present

## 2019-04-30 DIAGNOSIS — Z3689 Encounter for other specified antenatal screening: Secondary | ICD-10-CM

## 2019-04-30 DIAGNOSIS — O10013 Pre-existing essential hypertension complicating pregnancy, third trimester: Secondary | ICD-10-CM | POA: Insufficient documentation

## 2019-04-30 LAB — CBC
HCT: 35 % — ABNORMAL LOW (ref 36.0–46.0)
HCT: 37.3 % (ref 36.0–46.0)
Hemoglobin: 11.7 g/dL — ABNORMAL LOW (ref 12.0–15.0)
Hemoglobin: 12.4 g/dL (ref 12.0–15.0)
MCH: 30.5 pg (ref 26.0–34.0)
MCH: 30.2 pg (ref 26.0–34.0)
MCHC: 33.4 g/dL (ref 30.0–36.0)
MCHC: 33.2 g/dL (ref 30.0–36.0)
MCV: 91.1 fL (ref 80.0–100.0)
MCV: 91 fL (ref 80.0–100.0)
Platelets: 219 10*3/uL (ref 150–400)
Platelets: 263 10*3/uL (ref 150–400)
RBC: 3.84 MIL/uL — ABNORMAL LOW (ref 3.87–5.11)
RBC: 4.1 MIL/uL (ref 3.87–5.11)
RDW: 13.1 % (ref 11.5–15.5)
RDW: 13.1 % (ref 11.5–15.5)
WBC: 9.9 10*3/uL (ref 4.0–10.5)
WBC: 10.1 10*3/uL (ref 4.0–10.5)
nRBC: 0 % (ref 0.0–0.2)
nRBC: 0 % (ref 0.0–0.2)

## 2019-04-30 LAB — COMPREHENSIVE METABOLIC PANEL
ALT: 16 U/L (ref 0–44)
AST: 18 U/L (ref 15–41)
Albumin: 2.6 g/dL — ABNORMAL LOW (ref 3.5–5.0)
Alkaline Phosphatase: 117 U/L (ref 38–126)
Anion gap: 6 (ref 5–15)
BUN: 9 mg/dL (ref 6–20)
CO2: 24 mmol/L (ref 22–32)
Calcium: 9.3 mg/dL (ref 8.9–10.3)
Chloride: 107 mmol/L (ref 98–111)
Creatinine, Ser: 0.62 mg/dL (ref 0.44–1.00)
GFR calc Af Amer: >60 mL/min (ref >60)
GFR calc non Af Amer: >60 mL/min (ref >60)
Glucose, Bld: 108 mg/dL — ABNORMAL HIGH (ref 70–99)
Potassium: 4.2 mmol/L (ref 3.5–5.1)
Sodium: 137 mmol/L (ref 135–145)
Total Bilirubin: 0.3 mg/dL (ref 0.3–1.2)
Total Protein: 5.4 g/dL — ABNORMAL LOW (ref 6.5–8.1)

## 2019-04-30 LAB — TYPE AND SCREEN
ABO/RH(D): A NEG
Antibody Screen: POSITIVE

## 2019-04-30 LAB — SARS CORONAVIRUS 2 (TAT 6-24 HRS): SARS Coronavirus 2: NEGATIVE

## 2019-04-30 LAB — PROTEIN / CREATININE RATIO, URINE
Creatinine, Urine: 23.02 mg/dL
Total Protein, Urine: <10 mg/dL

## 2019-04-30 LAB — GROUP B STREP BY PCR: Group B strep by PCR: NEGATIVE

## 2019-04-30 MED ORDER — MISOPROSTOL 50MCG HALF TABLET
50.0000 ug | ORAL_TABLET | ORAL | Status: DC
  Administered 2019-04-30 – 2019-05-01 (×2): 50 ug via ORAL
  Filled 2019-04-30: qty 1

## 2019-04-30 MED ORDER — TERBUTALINE SULFATE 1 MG/ML IJ SOLN: 0.2500 mg | Freq: Once | INTRAMUSCULAR | Status: DC | PRN

## 2019-04-30 MED ORDER — ALUM & MAG HYDROXIDE-SIMETH 200-200-20 MG/5ML PO SUSP
15.0000 mL | Freq: Four times a day (QID) | ORAL | Status: DC | PRN
  Filled 2019-04-30: qty 30

## 2019-04-30 MED ORDER — BUTORPHANOL TARTRATE 1 MG/ML IJ SOLN
1.0000 mg | INTRAMUSCULAR | Status: DC | PRN
  Administered 2019-05-01 (×3): 1 mg via INTRAVENOUS
  Filled 2019-04-30 (×3): qty 1

## 2019-04-30 MED ORDER — OXYTOCIN BOLUS FROM INFUSION
500.0000 mL | Freq: Once | INTRAVENOUS | Status: AC
  Administered 2019-05-01: 500 mL via INTRAVENOUS

## 2019-04-30 MED ORDER — ALUM & MAG HYDROXIDE-SIMETH 200-200-20 MG/5 ML NICU TOPICAL
1.0000 "application " | Freq: Once | TOPICAL | Status: DC
  Filled 2019-04-30: qty 355

## 2019-04-30 MED ORDER — MISOPROSTOL 25 MCG QUARTER TABLET
25.0000 ug | ORAL_TABLET | ORAL | Status: DC | PRN
  Administered 2019-04-30: 25 ug via VAGINAL
  Filled 2019-04-30: qty 1

## 2019-04-30 MED ORDER — MISOPROSTOL 50MCG HALF TABLET
ORAL_TABLET | ORAL | Status: AC
  Filled 2019-04-30: qty 1

## 2019-04-30 MED ORDER — LACTATED RINGERS IV SOLN: 500.0000 mL | INTRAVENOUS | Status: DC | PRN

## 2019-04-30 MED ORDER — ONDANSETRON HCL 4 MG/2ML IJ SOLN
4.0000 mg | Freq: Four times a day (QID) | INTRAMUSCULAR | Status: DC | PRN
  Administered 2019-04-30: 4 mg via INTRAVENOUS
  Filled 2019-04-30: qty 2

## 2019-04-30 MED ORDER — ACETAMINOPHEN 325 MG PO TABS
650.0000 mg | ORAL_TABLET | ORAL | Status: DC | PRN
  Administered 2019-04-30: 650 mg via ORAL
  Filled 2019-04-30: qty 2

## 2019-04-30 MED ORDER — OXYTOCIN 40 UNITS IN NORMAL SALINE INFUSION - SIMPLE MED: 2.5000 [IU]/h | INTRAVENOUS | Status: DC

## 2019-04-30 MED ORDER — NIFEDIPINE ER OSMOTIC RELEASE 30 MG PO TB24
60.0000 mg | ORAL_TABLET | Freq: Once | ORAL | Status: AC
  Administered 2019-04-30: 60 mg via ORAL
  Filled 2019-04-30: qty 2

## 2019-04-30 MED ORDER — LIDOCAINE HCL (PF) 1 % IJ SOLN
30.0000 mL | INTRAMUSCULAR | Status: AC | PRN
  Administered 2019-05-01: 30 mL via SUBCUTANEOUS
  Filled 2019-04-30: qty 30

## 2019-04-30 MED ORDER — LACTATED RINGERS IV SOLN: INTRAVENOUS | Status: DC

## 2019-04-30 MED ORDER — SOD CITRATE-CITRIC ACID 500-334 MG/5ML PO SOLN: 30.0000 mL | ORAL | Status: DC | PRN

## 2019-04-30 NOTE — Discharge Instructions (Signed)

## 2019-04-30 NOTE — Progress Notes (Signed)
Patient ID: Holly Jacobs, female   DOB: [birth year], 24 y.o.   MRN: [id number] Cooks catheter placed with 60u/40v saline oral cytotec now  Monitor BP Maalox for reflux

## 2019-04-30 NOTE — H&P (Signed)
Holly Jacobs is a 24 y.o.prime female presenting at 47 5/7wks for direct admission due to severe range BP and severe features. Pt has pregnancy induced hypertension noted at [redacted] weeks gestation. She was started on procardia at 33 weeks and now increased to 60xl daily. Pt noted in office today to have severe range BP of 160s/90. PReE labs were normal in MAU a few hours earlier however pt reported visual changes, headache and malaise. She received BMZ at 34 weeks. Rapid GBS was obtained. First trimester screen was negative.  OB History    Gravida  1   Para  0   Term  0   Preterm  0   AB  0   Living  0     SAB  0   TAB  0   Ectopic  0   Multiple  0   Live Births  0          Past Medical History:  Diagnosis Date  . Depression   . Hypertension   . Migraine    Past Surgical History:  Procedure Laterality Date  . CYSTOSCOPY W/ URETERAL STENT REMOVAL Right 12/30/2012   Procedure: CYSTOSCOPY WITH STENT REMOVAL;  Surgeon: Ky Barban, MD;  Location: [facility];  Service: Urology;  Laterality: Right;  . CYSTOSCOPY WITH RETROGRADE PYELOGRAM, URETEROSCOPY AND STENT PLACEMENT Left 12/29/2012   Procedure: CYSTOSCOPY WITH LEFT RETROGRADE PYELOGRAM, BALLOON DILATION LEFT URETER; LEFT URETEROSCOPY ;  Surgeon: Ky Barban, MD;  Location: [facility];  Service: Urology;  Laterality: Left;  . CYSTOSCOPY WITH STENT PLACEMENT Right 12/29/2012   Procedure: CYSTOSCOPY WITH STENT PLACEMENT RIGHT URETER;  Surgeon: Ky Barban, MD;  Location: [facility];  Service: Urology;  Laterality: Right;   Family History: family history includes Diabetes in her maternal grandmother; High Cholesterol in her maternal grandmother; Migraines in her father; Skin cancer in her maternal grandmother. Social History:  reports that she has never smoked. She has never used smokeless tobacco. She reports that she does not drink alcohol or use drugs.     Maternal Diabetes: No Genetic Screening: Normal Maternal  Ultrasounds/Referrals: Normal Fetal Ultrasounds or other Referrals:  None Maternal Substance Abuse:  No Significant Maternal Medications:  Meds include: Other: procardia 60xl Significant Maternal Lab Results:  Group B Strep negative Other Comments:  None  Review of Systems  Eyes: Positive for visual disturbance.  Respiratory: Negative for chest tightness and shortness of breath.   Gastrointestinal: Negative for abdominal pain.  Neurological: Positive for headaches. Negative for dizziness and light-headedness.  Psychiatric/Behavioral: The patient is nervous/anxious.    Maternal Medical History:  Reason for admission: Preeclampsia per sever symptoms  Contractions: Frequency: rare.   Perceived severity is mild.    Fetal activity: Perceived fetal activity is normal.   Last perceived fetal movement was within the past hour.    Prenatal complications: PIH.   Prenatal Complications - Diabetes: none.    Dilation: Closed Effacement (%): Thick Station: Ballotable Exam by:: beitz, rn  Blood pressure (!) 144/88, pulse 88, temperature 98 F (36.7 C), temperature source Oral, resp. rate 18, height 5\' 5"  (1.651 m), weight 100.7 kg, last menstrual period 08/23/2018. Maternal Exam:  Uterine Assessment: Contraction strength is mild.  Contraction frequency is irregular.   Abdomen: Patient reports generalized tenderness.  Estimated fetal weight is AGA.   Fetal presentation: vertex  Introitus: Normal vulva. Vulva is negative for condylomata and lesion.  Normal vagina.  Vagina is negative for condylomata.  Pelvis: adequate  for delivery.   Cervix: Cervix evaluated by digital exam.     Fetal Exam Fetal Monitor Review: Baseline rate: 135.  Variability: moderate (6-25 bpm).   Pattern: accelerations present.    Fetal State Assessment: Category I - tracings are normal.     Physical Exam  Constitutional: She appears well-developed and well-nourished.  Cardiovascular: Normal rate.   Respiratory: Effort normal.  GI: Soft. There is generalized abdominal tenderness.  Genitourinary:    Vulva, vagina and uterus normal.     No vulval condylomata or lesion noted.   Musculoskeletal:        General: Normal range of motion.     Cervical back: Normal range of motion.  Neurological: She is alert.  Skin: Skin is warm.  Psychiatric: She has a normal mood and affect. Her behavior is normal. Judgment and thought content normal.    Prenatal labs: ABO, Rh: --/--/A NEG (03/08 1414) Antibody: POS (03/08 1414) Rubella:   RPR:    HBsAg:    HIV:    GBS: --/NEGATIVE (03/08 1414)   Assessment/Plan: 23yo prime at 45 5/7wks with preE for iol -Admitted -S/P Cytotec - GBS neg - Covid neg - Foley catheter placed with 60u/30v - Pain control prn - BP control - 60xl procardia daily - Anticipate svd   [name] 04/30/2019, 8:12 PM

## 2019-04-30 NOTE — MAU Note (Signed)
Covid swab collected

## 2019-04-30 NOTE — MAU Provider Note (Signed)
History     CSN: [id number]  Arrival date and time: 04/30/19 0445   First Provider Initiated Contact with Patient 04/30/19 [phone]      Chief Complaint  Patient presents with  . Hypertension   HPI DAVIANNA DEUTSCHMAN is a 24 y.o. G1P0000 at [redacted]w[redacted]d who presents to MAU with chief complaint of severe range pressures on her home cuff. Her pregnancy is c/b Chronic Hypertension on Nifedipine 60 mg 24 hour tablet daily. She has not taken today's dose. Patient endorses visual disturbances. She states this has been the case "for a couple months now". She denies headache, RUQ/epigastric pain, new onset swelling or weight gain.  Patient receives prenatal care with [facility] and her next appointment is this morning at 11.  OB History    Gravida  1   Para  0   Term  0   Preterm  0   AB  0   Living  0     SAB  0   TAB  0   Ectopic  0   Multiple  0   Live Births  0           Past Medical History:  Diagnosis Date  . Depression   . Hypertension   . Migraine     Past Surgical History:  Procedure Laterality Date  . CYSTOSCOPY W/ URETERAL STENT REMOVAL Right 12/30/2012   Procedure: CYSTOSCOPY WITH STENT REMOVAL;  Surgeon: Marissa Nestle, MD;  Location: [facility];  Service: Urology;  Laterality: Right;  . CYSTOSCOPY WITH RETROGRADE PYELOGRAM, URETEROSCOPY AND STENT PLACEMENT Left 12/29/2012   Procedure: CYSTOSCOPY WITH LEFT RETROGRADE PYELOGRAM, BALLOON DILATION LEFT URETER; LEFT URETEROSCOPY ;  Surgeon: Marissa Nestle, MD;  Location: [facility];  Service: Urology;  Laterality: Left;  . CYSTOSCOPY WITH STENT PLACEMENT Right 12/29/2012   Procedure: CYSTOSCOPY WITH STENT PLACEMENT RIGHT URETER;  Surgeon: Marissa Nestle, MD;  Location: [facility];  Service: Urology;  Laterality: Right;    Family History  Problem Relation Age of Onset  . Migraines Father   . Skin cancer Maternal Grandmother   . Diabetes Maternal Grandmother   . High Cholesterol Maternal Grandmother     Social  History   Tobacco Use  . Smoking status: Never Smoker  . Smokeless tobacco: Never Used  Substance Use Topics  . Alcohol use: No  . Drug use: No    Allergies:  Allergies  Allergen Reactions  . Imitrex [Sumatriptan Base] Anaphylaxis, Shortness Of Breath and Itching  . Sulfa Antibiotics Anaphylaxis  . Latex Hives  . Tramadol Hives    Medications Prior to Admission  Medication Sig Dispense Refill Last Dose  . aspirin EC 81 MG tablet Take 1 tablet (81 mg total) by mouth daily. 30 tablet 1 04/29/2019 at Unknown time  . cyclobenzaprine (FLEXERIL) 10 MG tablet Take 10 mg by mouth 3 (three) times daily as needed for muscle spasms.   Past Week at Unknown time  . ferrous sulfate 325 (65 FE) MG tablet Take 325 mg by mouth every other day.   04/29/2019 at Unknown time  . NIFEdipine (ADALAT CC) 60 MG 24 hr tablet Take 1 tablet (60 mg total) by mouth daily. 30 tablet 1 04/29/2019 at Unknown time  . Prenatal Vit-Fe Fumarate-FA (PRENATAL MULTIVITAMIN) TABS tablet Take 1 tablet by mouth daily at 12 noon.   04/30/2019 at Unknown time  . promethazine (PHENERGAN) 25 MG tablet Take 1 tablet (25 mg total) by mouth every 6 (six) hours  as needed for nausea or vomiting. 30 tablet 0 04/29/2019 at Unknown time  . acetaminophen (TYLENOL) 500 MG tablet Take 1,000 mg by mouth every 6 (six) hours as needed for mild pain or headache.       Review of Systems  Constitutional: Negative for chills, fatigue and fever.  Eyes: Positive for visual disturbance. Negative for photophobia.  Respiratory: Negative for shortness of breath.   Gastrointestinal: Negative for abdominal pain.  Genitourinary: Negative for vaginal bleeding.  Musculoskeletal: Negative for back pain.  Neurological: Negative for headaches.  All other systems reviewed and are negative.  Physical Exam   Blood pressure (!) 149/89, pulse (!) 106, temperature 98.6 F (37 C), temperature source Oral, resp. rate 18, height 5\' 5"  (1.651 m), weight 98.9 kg, last  menstrual period 08/23/2018, SpO2 98 %.  Physical Exam  Nursing note and vitals reviewed. Constitutional: She is oriented to person, place, and time. She appears well-developed and well-nourished.  Eyes: Lids are normal.  Cardiovascular: Normal rate and normal heart sounds.  Respiratory: Effort normal and breath sounds normal. She has no decreased breath sounds.  GI: She exhibits no distension. There is no abdominal tenderness. There is no rebound, no guarding and no CVA tenderness.  Gravid  Neurological: She is alert and oriented to person, place, and time.  Skin: Skin is warm and dry.  Psychiatric: She has a normal mood and affect. Her behavior is normal. Judgment and thought content normal.    MAU Course/MDM  Procedures   --Reactive tracing: baseline 135, mod var, + accels, no decels --Toco: quiet --Vital signs , lab results, recurrent scotomata and plan for discharge discussed with Dr. 10/24/2018 at [phone]  Patient Vitals for the past 24 hrs:  BP Temp Temp src Pulse Resp SpO2 Height Weight  04/30/19 0750 -- 98 F (36.7 C) Oral -- 20 99 % -- --  04/30/19 0745 (!) 145/94 -- -- 99 -- 99 % -- --  04/30/19 0740 -- -- -- -- -- 99 % -- --  04/30/19 0730 (!) 143/96 -- -- (!) 112 -- 98 % -- --  04/30/19 0715 (!) 136/93 -- -- 100 -- 98 % -- --  04/30/19 0650 -- -- -- -- -- 97 % -- --  04/30/19 0645 132/84 -- -- (!) 105 -- 96 % -- --  04/30/19 0635 -- -- -- -- -- 96 % -- --  04/30/19 0630 (!) 125/94 -- -- (!) 103 -- 95 % -- --  04/30/19 0625 -- -- -- -- -- 96 % -- --  04/30/19 0615 (!) 133/93 -- -- 96 -- 97 % -- --  04/30/19 0610 -- -- -- -- -- 97 % -- --  04/30/19 0605 -- -- -- -- -- 98 % -- --  04/30/19 0600 131/80 -- -- (!) 106 -- -- -- --  04/30/19 0555 -- -- -- -- -- 98 % -- --  04/30/19 0550 -- -- -- -- -- 98 % -- --  04/30/19 0545 (!) 145/93 -- -- 97 -- 98 % -- --  04/30/19 0540 -- -- -- -- -- 98 % -- --  04/30/19 0530 (!) 149/89 -- -- (!) 106 -- 98 % -- --  04/30/19 0510 (!)  149/98 98.6 F (37 C) Oral (!) 113 18 98 % -- --  04/30/19 0508 -- -- -- -- -- -- 5\' 5"  (1.651 m) 98.9 kg   Results for orders placed or performed during the hospital encounter of 04/30/19 (from the past 24 hour(s))  Protein / creatinine ratio, urine     Status: None   Collection Time: 04/30/19  4:57 AM  Result Value Ref Range   Creatinine, Urine 23.02 mg/dL   Total Protein, Urine <10 mg/dL   Protein Creatinine Ratio        0.00 - 0.15 mg/mg[Cre]  CBC     Status: Abnormal   Collection Time: 04/30/19  5:42 AM  Result Value Ref Range   WBC 9.9 4.0 - 10.5 K/uL   RBC 3.84 (L) 3.87 - 5.11 MIL/uL   Hemoglobin 11.7 (L) 12.0 - 15.0 g/dL   HCT 49.7 (L) 02.6 - 37.8 %   MCV 91.1 80.0 - 100.0 fL   MCH 30.5 26.0 - 34.0 pg   MCHC 33.4 30.0 - 36.0 g/dL   RDW 58.8 50.2 - 77.4 %   Platelets 219 150 - 400 K/uL   nRBC 0.0 0.0 - 0.2 %  Comprehensive metabolic panel     Status: Abnormal   Collection Time: 04/30/19  5:42 AM  Result Value Ref Range   Sodium 137 135 - 145 mmol/L   Potassium 4.2 3.5 - 5.1 mmol/L   Chloride 107 98 - 111 mmol/L   CO2 24 22 - 32 mmol/L   Glucose, Bld 108 (H) 70 - 99 mg/dL   BUN 9 6 - 20 mg/dL   Creatinine, Ser 1.28 0.44 - 1.00 mg/dL   Calcium 9.3 8.9 - 78.6 mg/dL   Total Protein 5.4 (L) 6.5 - 8.1 g/dL   Albumin 2.6 (L) 3.5 - 5.0 g/dL   AST 18 15 - 41 U/L   ALT 16 0 - 44 U/L   Alkaline Phosphatase 117 38 - 126 U/L   Total Bilirubin 0.3 0.3 - 1.2 mg/dL   GFR calc non Af Amer >60 >60 mL/min   GFR calc Af Amer >60 >60 mL/min   Anion gap 6 5 - 15    Assessment and Plan  --24 y.o. G1P0000 at [redacted]w[redacted]d  --Reactive tracing --No severe range bps in MAU --Normal PEC labs --S/p consult with Dr. Mindi Slicker, discharge home in stable condition  F/U: --Patient to keep appointment for 11am today  Calvert Cantor, CNM 04/30/2019, 8:18 AM

## 2019-04-30 NOTE — MAU Note (Signed)
Woke up at 2:15 am and saw spots, took blood pressure was 175/96 at 2:45 am, then at 2:55 am, was 165/103.  Taking Procardia ER once a day, took yesterday 10 am. No bleeding. No leaking. Baby not moving as much as usual, last movement was 9 pm.  No headache. Still seeing spots or floaters. No abd pain.

## 2019-05-01 ENCOUNTER — Inpatient Hospital Stay (HOSPITAL_COMMUNITY): Payer: Medicaid Other | Admitting: Anesthesiology

## 2019-05-01 ENCOUNTER — Encounter (HOSPITAL_COMMUNITY): Payer: Self-pay | Admitting: Obstetrics and Gynecology

## 2019-05-01 LAB — CBC
HCT: 37.4 % (ref 36.0–46.0)
HCT: 34.8 % — ABNORMAL LOW (ref 36.0–46.0)
Hemoglobin: 12.5 g/dL (ref 12.0–15.0)
Hemoglobin: 11.5 g/dL — ABNORMAL LOW (ref 12.0–15.0)
MCH: 30 pg (ref 26.0–34.0)
MCH: 30.2 pg (ref 26.0–34.0)
MCHC: 33.4 g/dL (ref 30.0–36.0)
MCHC: 33 g/dL (ref 30.0–36.0)
MCV: 89.9 fL (ref 80.0–100.0)
MCV: 91.3 fL (ref 80.0–100.0)
Platelets: 251 10*3/uL (ref 150–400)
Platelets: 233 10*3/uL (ref 150–400)
RBC: 4.16 MIL/uL (ref 3.87–5.11)
RBC: 3.81 MIL/uL — ABNORMAL LOW (ref 3.87–5.11)
RDW: 13.1 % (ref 11.5–15.5)
RDW: 13.3 % (ref 11.5–15.5)
WBC: 13.8 10*3/uL — ABNORMAL HIGH (ref 4.0–10.5)
WBC: 17.5 10*3/uL — ABNORMAL HIGH (ref 4.0–10.5)
nRBC: 0 % (ref 0.0–0.2)
nRBC: 0 % (ref 0.0–0.2)

## 2019-05-01 LAB — RPR: RPR Ser Ql: NONREACTIVE

## 2019-05-01 MED ORDER — MAGNESIUM HYDROXIDE 400 MG/5ML PO SUSP: 30.0000 mL | ORAL | Status: DC | PRN

## 2019-05-01 MED ORDER — HYDRALAZINE HCL 20 MG/ML IJ SOLN: 10.0000 mg | INTRAMUSCULAR | Status: DC | PRN

## 2019-05-01 MED ORDER — TERBUTALINE SULFATE 1 MG/ML IJ SOLN: 0.2500 mg | Freq: Once | INTRAMUSCULAR | Status: DC | PRN

## 2019-05-01 MED ORDER — FENTANYL-BUPIVACAINE-NACL 0.5-0.125-0.9 MG/250ML-% EP SOLN
12.0000 mL/h | EPIDURAL | Status: DC | PRN
  Filled 2019-05-01: qty 250

## 2019-05-01 MED ORDER — OXYCODONE HCL 5 MG PO TABS: 10.0000 mg | ORAL_TABLET | ORAL | Status: DC | PRN

## 2019-05-01 MED ORDER — DIPHENHYDRAMINE HCL 25 MG PO CAPS: 25.0000 mg | ORAL_CAPSULE | Freq: Four times a day (QID) | ORAL | Status: DC | PRN

## 2019-05-01 MED ORDER — METHYLERGONOVINE MALEATE 0.2 MG/ML IJ SOLN: 0.2000 mg | INTRAMUSCULAR | Status: DC | PRN

## 2019-05-01 MED ORDER — WITCH HAZEL-GLYCERIN EX PADS: 1.0000 "application " | MEDICATED_PAD | CUTANEOUS | Status: DC | PRN

## 2019-05-01 MED ORDER — COCONUT OIL OIL: 1.0000 "application " | TOPICAL_OIL | Status: DC | PRN

## 2019-05-01 MED ORDER — PHENYLEPHRINE 40 MCG/ML (10ML) SYRINGE FOR IV PUSH (FOR BLOOD PRESSURE SUPPORT): 80.0000 ug | PREFILLED_SYRINGE | INTRAVENOUS | Status: DC | PRN

## 2019-05-01 MED ORDER — NIFEDIPINE ER OSMOTIC RELEASE 30 MG PO TB24
30.0000 mg | ORAL_TABLET | Freq: Every day | ORAL | Status: DC
  Administered 2019-05-01: 30 mg via ORAL
  Filled 2019-05-01: qty 1

## 2019-05-01 MED ORDER — DIPHENHYDRAMINE HCL 50 MG/ML IJ SOLN: 12.5000 mg | INTRAMUSCULAR | Status: DC | PRN

## 2019-05-01 MED ORDER — ONDANSETRON HCL 4 MG/2ML IJ SOLN
4.0000 mg | INTRAMUSCULAR | Status: DC | PRN
  Administered 2019-05-01 (×3): 4 mg via INTRAVENOUS
  Filled 2019-05-01 (×3): qty 2

## 2019-05-01 MED ORDER — LABETALOL HCL 5 MG/ML IV SOLN
20.0000 mg | INTRAVENOUS | Status: DC | PRN
  Administered 2019-05-01 (×3): 20 mg via INTRAVENOUS
  Filled 2019-05-01 (×3): qty 4

## 2019-05-01 MED ORDER — SENNOSIDES-DOCUSATE SODIUM 8.6-50 MG PO TABS
2.0000 | ORAL_TABLET | ORAL | Status: DC
  Administered 2019-05-01 – 2019-05-02 (×2): 2 via ORAL
  Filled 2019-05-01 (×2): qty 2

## 2019-05-01 MED ORDER — OXYCODONE HCL 5 MG PO TABS: 5.0000 mg | ORAL_TABLET | ORAL | Status: DC | PRN

## 2019-05-01 MED ORDER — PROMETHAZINE HCL 25 MG/ML IJ SOLN
12.5000 mg | INTRAMUSCULAR | Status: DC | PRN
  Administered 2019-05-01: 12.5 mg via INTRAVENOUS
  Filled 2019-05-01: qty 1

## 2019-05-01 MED ORDER — SODIUM CHLORIDE (PF) 0.9 % IJ SOLN
INTRAMUSCULAR | Status: DC | PRN
  Administered 2019-05-01: 12 mL/h via EPIDURAL

## 2019-05-01 MED ORDER — IBUPROFEN 600 MG PO TABS
600.0000 mg | ORAL_TABLET | Freq: Four times a day (QID) | ORAL | Status: DC
  Administered 2019-05-01 – 2019-05-04 (×11): 600 mg via ORAL
  Filled 2019-05-01 (×11): qty 1

## 2019-05-01 MED ORDER — ONDANSETRON HCL 4 MG/2ML IJ SOLN: 4.0000 mg | INTRAMUSCULAR | Status: DC | PRN

## 2019-05-01 MED ORDER — NIFEDIPINE ER OSMOTIC RELEASE 30 MG PO TB24
30.0000 mg | ORAL_TABLET | Freq: Once | ORAL | Status: AC
  Administered 2019-05-01: 30 mg via ORAL
  Filled 2019-05-01: qty 1

## 2019-05-01 MED ORDER — TETANUS-DIPHTH-ACELL PERTUSSIS 5-2.5-18.5 LF-MCG/0.5 IM SUSP: 0.5000 mL | Freq: Once | INTRAMUSCULAR | Status: DC

## 2019-05-01 MED ORDER — METHYLERGONOVINE MALEATE 0.2 MG PO TABS: 0.2000 mg | ORAL_TABLET | ORAL | Status: DC | PRN

## 2019-05-01 MED ORDER — EPHEDRINE 5 MG/ML INJ: 10.0000 mg | INTRAVENOUS | Status: DC | PRN

## 2019-05-01 MED ORDER — SIMETHICONE 80 MG PO CHEW: 80.0000 mg | CHEWABLE_TABLET | ORAL | Status: DC | PRN

## 2019-05-01 MED ORDER — MEASLES, MUMPS & RUBELLA VAC IJ SOLR: 0.5000 mL | Freq: Once | INTRAMUSCULAR | Status: DC

## 2019-05-01 MED ORDER — ZOLPIDEM TARTRATE 5 MG PO TABS
5.0000 mg | ORAL_TABLET | Freq: Every evening | ORAL | Status: DC | PRN
  Administered 2019-05-03: 5 mg via ORAL
  Filled 2019-05-01: qty 1

## 2019-05-01 MED ORDER — LABETALOL HCL 5 MG/ML IV SOLN: 40.0000 mg | INTRAVENOUS | Status: DC | PRN

## 2019-05-01 MED ORDER — ACETAMINOPHEN 325 MG PO TABS
650.0000 mg | ORAL_TABLET | ORAL | Status: DC | PRN
  Administered 2019-05-03 – 2019-05-04 (×4): 650 mg via ORAL
  Filled 2019-05-01 (×4): qty 2

## 2019-05-01 MED ORDER — PRENATAL MULTIVITAMIN CH
1.0000 | ORAL_TABLET | Freq: Every day | ORAL | Status: DC
  Administered 2019-05-02 – 2019-05-03 (×2): 1 via ORAL
  Filled 2019-05-01 (×2): qty 1

## 2019-05-01 MED ORDER — NIFEDIPINE ER OSMOTIC RELEASE 30 MG PO TB24
60.0000 mg | ORAL_TABLET | Freq: Every day | ORAL | Status: DC
  Administered 2019-05-02: 60 mg via ORAL
  Filled 2019-05-01: qty 2

## 2019-05-01 MED ORDER — LABETALOL HCL 5 MG/ML IV SOLN: 80.0000 mg | INTRAVENOUS | Status: DC | PRN

## 2019-05-01 MED ORDER — LACTATED RINGERS IV SOLN
500.0000 mL | Freq: Once | INTRAVENOUS | Status: AC
  Administered 2019-05-01: 500 mL via INTRAVENOUS

## 2019-05-01 MED ORDER — ONDANSETRON HCL 4 MG PO TABS: 4.0000 mg | ORAL_TABLET | ORAL | Status: DC | PRN

## 2019-05-01 MED ORDER — BENZOCAINE-MENTHOL 20-0.5 % EX AERO
1.0000 "application " | INHALATION_SPRAY | CUTANEOUS | Status: DC | PRN
  Administered 2019-05-01 – 2019-05-03 (×2): 1 via TOPICAL
  Filled 2019-05-01 (×2): qty 56

## 2019-05-01 MED ORDER — OXYTOCIN 40 UNITS IN NORMAL SALINE INFUSION - SIMPLE MED
1.0000 m[IU]/min | INTRAVENOUS | Status: DC
  Administered 2019-05-01: 2 m[IU]/min via INTRAVENOUS
  Filled 2019-05-01: qty 1000

## 2019-05-01 MED ORDER — LIDOCAINE-EPINEPHRINE (PF) 2 %-1:200000 IJ SOLN
INTRAMUSCULAR | Status: DC | PRN
  Administered 2019-05-01: 2 mL via EPIDURAL
  Administered 2019-05-01: 1 mL via EPIDURAL

## 2019-05-01 MED ORDER — DIBUCAINE (PERIANAL) 1 % EX OINT: 1.0000 "application " | TOPICAL_OINTMENT | CUTANEOUS | Status: DC | PRN

## 2019-05-01 NOTE — Progress Notes (Signed)
Feeling some ctx, some nausea.  Catheter came out a little over 2 hours ago Afeb, VSS, BP labile-130-160/70-100 FHT- 120-130, Cat I VE-3/70/-2, vtx, AROM clear Will continue pitocin, increase daily procardia back to 60 mg, IV labetalol prn, monitor progress

## 2019-05-01 NOTE — Anesthesia Procedure Notes (Signed)
Epidural Patient location during procedure: OB Start time: 05/01/2019 9:45 AM End time: 05/01/2019 10:00 AM  Staffing Anesthesiologist: Elmer Picker, MD Performed: anesthesiologist   Preanesthetic Checklist Completed: patient identified, IV checked, risks and benefits discussed, monitors and equipment checked, pre-op evaluation and timeout performed  Epidural Patient position: sitting Prep: DuraPrep and site prepped and draped Patient monitoring: continuous pulse ox, blood pressure, heart rate and cardiac monitor Approach: midline Location: L3-L4 Injection technique: LOR air  Needle:  Needle type: Tuohy  Needle gauge: 17 G Needle length: 9 cm Needle insertion depth: 6 cm Catheter type: closed end flexible Catheter size: 19 Gauge Catheter at skin depth: 12 cm Test dose: negative  Assessment Sensory level: T8 Events: blood not aspirated, injection not painful, no injection resistance, no paresthesia and negative IV test  Additional Notes Patient identified. Risks/Benefits/Options discussed with patient including but not limited to bleeding, infection, nerve damage, paralysis, failed block, incomplete pain control, headache, blood pressure changes, nausea, vomiting, reactions to medication both or allergic, itching and postpartum back pain. Confirmed with bedside nurse the patient's most recent platelet count. Confirmed with patient that they are not currently taking any anticoagulation, have any bleeding history or any family history of bleeding disorders. Patient expressed understanding and wished to proceed. All questions were answered. Sterile technique was used throughout the entire procedure. Please see nursing notes for vital signs. Test dose was given through epidural catheter and negative prior to continuing to dose epidural or start infusion. Warning signs of high block given to the patient including shortness of breath, tingling/numbness in hands, complete motor block, or  any concerning symptoms with instructions to call for help. Patient was given instructions on fall risk and not to get out of bed. All questions and concerns addressed with instructions to call with any issues or inadequate analgesia.  Reason for block:procedure for pain

## 2019-05-01 NOTE — Lactation Note (Signed)
This note was copied from a baby's chart. Lactation Consultation Note  Patient Name: Holly Jacobs EHMCN'O Date: 05/01/2019   Mom is G1P1.  Baby Holly sts on moms chest.  Mom has DEBP in room and has inititiated pumping with DEBP.  Mom reports she did not get anything with pumping.  Dad reports that they got two teaspons of breastmilk with hand expression. Reviewed LPTI guidelines and gave parents LPTI green sheet.   Urged to offer the breast on cue  8-12 or more times day.  Hand express and pump past breastfeedings and feed back all Expressed mothers milk.  Parents report they understand to supplement past breastfeedings with Expressed breastmilk. Reviewed recommended amounts.  Encouraged to offer [place] more if she wanted more. Parents report they would like donor milk if unable to have enough of their own milk.  Dad reports he read a lot about breastfeeding and does not want her to have formula.  Praised decision to breastfeed.  Reviewed [facility] breastfeeding consultation services handouts.  Mom reports she has DEBP for home use.  Urged parents to call lactation as needed  Maternal Data    Feeding Feeding Type: Breast Milk  LATCH Score                   Interventions    Lactation Tools Discussed/Used Pump Review: Setup, frequency, and cleaning;Milk Storage Initiated by:: Brittney Lipscomb,RN Date initiated:: 05/01/19   Consult Status      Holly Jacobs 05/01/2019, 10:41 PM

## 2019-05-01 NOTE — Lactation Note (Signed)
This note was copied from a baby's chart. Lactation Consultation Note  Patient Name: Holly Jacobs Date: 05/01/2019   Attempt to see patient.  Lc arrived at the same time as parents.  Left handouts and told parents would give them a few minutes to get settled and come back for a visit.  Maternal Data    Feeding    LATCH Score Latch: Repeated attempts needed to sustain latch, nipple held in mouth throughout feeding, stimulation needed to elicit sucking reflex.  Audible Swallowing: A few with stimulation  Type of Nipple: Everted at rest and after stimulation  Comfort (Breast/Nipple): Soft / non-tender  Hold (Positioning): Assistance needed to correctly position infant at breast and maintain latch.  LATCH Score: 7  Interventions Interventions: Breast feeding basics reviewed;Assisted with latch;Skin to skin;Breast massage;Hand express;Support pillows  Lactation Tools Discussed/Used     Consult Status      Neomia Dear 05/01/2019, 6:28 PM

## 2019-05-01 NOTE — Anesthesia Preprocedure Evaluation (Signed)
Anesthesia Evaluation  Patient identified by MRN, date of birth, ID band Patient awake    Reviewed: Allergy & Precautions, NPO status , Patient's Chart, lab work & pertinent test results  Airway Mallampati: II  TM Distance: >3 FB Neck ROM: Full    Dental no notable dental hx.    Pulmonary neg pulmonary ROS,    Pulmonary exam normal breath sounds clear to auscultation       Cardiovascular hypertension (cHTN), Pt. on medications and Pt. on home beta blockers Normal cardiovascular exam Rhythm:Regular Rate:Normal     Neuro/Psych  Headaches, Depression negative psych ROS   GI/Hepatic negative GI ROS, Neg liver ROS,   Endo/Other  negative endocrine ROS  Renal/GU negative Renal ROS  negative genitourinary   Musculoskeletal negative musculoskeletal ROS (+)   Abdominal   Peds  Hematology negative hematology ROS (+)   Anesthesia Other Findings   Reproductive/Obstetrics (+) Pregnancy                             Anesthesia Physical Anesthesia Plan  ASA: III  Anesthesia Plan: Epidural   Post-op Pain Management:    Induction:   PONV Risk Score and Plan: Treatment may vary due to age or medical condition  Airway Management Planned: Natural Airway  Additional Equipment:   Intra-op Plan:   Post-operative Plan:   Informed Consent: I have reviewed the patients History and Physical, chart, labs and discussed the procedure including the risks, benefits and alternatives for the proposed anesthesia with the patient or authorized representative who has indicated his/her understanding and acceptance.       Plan Discussed with: Anesthesiologist  Anesthesia Plan Comments: (Patient identified. Risks, benefits, options discussed with patient including but not limited to bleeding, infection, nerve damage, paralysis, failed block, incomplete pain control, headache, blood pressure changes, nausea,  vomiting, reactions to medication, itching, and post partum back pain. Confirmed with bedside nurse the patient's most recent platelet count. Confirmed with the patient that they are not taking any anticoagulation, have any bleeding history or any family history of bleeding disorders. Patient expressed understanding and wishes to proceed. All questions were answered. )        Anesthesia Quick Evaluation

## 2019-05-01 NOTE — Progress Notes (Signed)
Feeling better with epidural Afeb, VSS, BP 120-160/70-90 FHT- 130s, mod variability, + accels and + scalp stim, some early and rare variable decel, Cat I, ctx not always tracing well VE-4/80/-1, vtx, IUPC placed Will continue pitocin, monitor progress and FHT, continue to treat BP prn

## 2019-05-02 LAB — COMPREHENSIVE METABOLIC PANEL
ALT: 14 U/L (ref 0–44)
AST: 28 U/L (ref 15–41)
Albumin: 2.3 g/dL — ABNORMAL LOW (ref 3.5–5.0)
Alkaline Phosphatase: 98 U/L (ref 38–126)
Anion gap: 9 (ref 5–15)
BUN: 8 mg/dL (ref 6–20)
CO2: 22 mmol/L (ref 22–32)
Calcium: 8.4 mg/dL — ABNORMAL LOW (ref 8.9–10.3)
Chloride: 107 mmol/L (ref 98–111)
Creatinine, Ser: 0.63 mg/dL (ref 0.44–1.00)
GFR calc Af Amer: >60 mL/min (ref >60)
GFR calc non Af Amer: >60 mL/min (ref >60)
Glucose, Bld: 112 mg/dL — ABNORMAL HIGH (ref 70–99)
Potassium: 3.2 mmol/L — ABNORMAL LOW (ref 3.5–5.1)
Sodium: 138 mmol/L (ref 135–145)
Total Bilirubin: 0.4 mg/dL (ref 0.3–1.2)
Total Protein: 4.9 g/dL — ABNORMAL LOW (ref 6.5–8.1)

## 2019-05-02 LAB — CBC
HCT: 32.7 % — ABNORMAL LOW (ref 36.0–46.0)
Hemoglobin: 10.6 g/dL — ABNORMAL LOW (ref 12.0–15.0)
MCH: 29.9 pg (ref 26.0–34.0)
MCHC: 32.4 g/dL (ref 30.0–36.0)
MCV: 92.4 fL (ref 80.0–100.0)
Platelets: 218 10*3/uL (ref 150–400)
RBC: 3.54 MIL/uL — ABNORMAL LOW (ref 3.87–5.11)
RDW: 13.4 % (ref 11.5–15.5)
WBC: 12.1 10*3/uL — ABNORMAL HIGH (ref 4.0–10.5)
nRBC: 0 % (ref 0.0–0.2)

## 2019-05-02 LAB — PROTEIN / CREATININE RATIO, URINE
Creatinine, Urine: 27.19 mg/dL
Protein Creatinine Ratio: 0.26 mg/mg{Cre} — ABNORMAL HIGH (ref 0.00–0.15)
Total Protein, Urine: 7 mg/dL

## 2019-05-02 MED ORDER — HYDRALAZINE HCL 20 MG/ML IJ SOLN: 10.0000 mg | INTRAMUSCULAR | Status: DC | PRN

## 2019-05-02 MED ORDER — MAGNESIUM SULFATE 40 GM/1000ML IV SOLN
2.0000 g/h | INTRAVENOUS | Status: DC
  Administered 2019-05-02 – 2019-05-03 (×2): 2 g/h via INTRAVENOUS
  Filled 2019-05-02 (×3): qty 1000

## 2019-05-02 MED ORDER — MAGNESIUM SULFATE BOLUS VIA INFUSION
4.0000 g | Freq: Once | INTRAVENOUS | Status: AC
  Administered 2019-05-02: 4 g via INTRAVENOUS
  Filled 2019-05-02: qty 1000

## 2019-05-02 MED ORDER — LABETALOL HCL 5 MG/ML IV SOLN
20.0000 mg | INTRAVENOUS | Status: DC | PRN
  Administered 2019-05-02: 20 mg via INTRAVENOUS
  Filled 2019-05-02: qty 4

## 2019-05-02 MED ORDER — RHO D IMMUNE GLOBULIN 1500 UNIT/2ML IJ SOSY
300.0000 ug | PREFILLED_SYRINGE | Freq: Once | INTRAMUSCULAR | Status: AC
  Administered 2019-05-02: 300 ug via INTRAMUSCULAR
  Filled 2019-05-02: qty 2

## 2019-05-02 MED ORDER — PROMETHAZINE HCL 25 MG/ML IJ SOLN
25.0000 mg | Freq: Four times a day (QID) | INTRAMUSCULAR | Status: DC | PRN
  Administered 2019-05-02: 25 mg via INTRAVENOUS
  Filled 2019-05-02: qty 1

## 2019-05-02 MED ORDER — LACTATED RINGERS IV SOLN: INTRAVENOUS | Status: DC

## 2019-05-02 MED ORDER — LABETALOL HCL 5 MG/ML IV SOLN: 80.0000 mg | INTRAVENOUS | Status: DC | PRN

## 2019-05-02 MED ORDER — LABETALOL HCL 5 MG/ML IV SOLN: 40.0000 mg | INTRAVENOUS | Status: DC | PRN

## 2019-05-02 NOTE — Anesthesia Postprocedure Evaluation (Signed)
Anesthesia Post Note  Patient: Holly Jacobs  Procedure(s) Performed: AN AD HOC LABOR EPIDURAL     Patient location during evaluation: Mother Baby Anesthesia Type: Epidural Level of consciousness: awake and alert Pain management: pain level controlled Vital Signs Assessment: post-procedure vital signs reviewed and stable Respiratory status: spontaneous breathing, nonlabored ventilation and respiratory function stable Cardiovascular status: stable Postop Assessment: no headache, no backache and epidural receding Anesthetic complications: no    Last Vitals:  Vitals:   05/02/19 0729 05/02/19 0747  BP: (!) 154/101 (!) 152/95  Pulse: 91 82  Resp: 20   Temp: 36.7 C   SpO2: 99%     Last Pain:  Vitals:   05/02/19 0729  TempSrc: Oral  PainSc: 0-No pain   Pain Goal:                Epidural/Spinal Function Cutaneous sensation: Normal sensation (05/02/19 0729)  Junious Silk

## 2019-05-02 NOTE — Progress Notes (Signed)
POSTPARTUM PROGRESS NOTE  Post Partum Day #1  Subjective:  No acute events overnight.  Pt denies problems with ambulating, voiding or po intake.  She denies nausea or vomiting.  Pain is well controlled.  She has had flatus. She has had bowel movement.  Lochia Minimal. Still with slight headache but feeling overall improved. Reviewed labor course as well as BP trend and need for several IV doses of meds intrapartum along with presenting HA and IOL. Patient BP is still 150s/100s despite now being on regularly scheduled meds. Reviewed with patient options of trending Bps and adding on meds vs starting 24hrs pp MgSO4 for seizure ppx given IOL for PreE   Objective: Blood pressure (!) 152/95, pulse 82, temperature 98 F (36.7 C), temperature source Oral, resp. rate 20, height 5\' 5"  (1.651 m), weight 100.7 kg, last menstrual period 08/23/2018, SpO2 99 %, unknown if currently breastfeeding.  Physical Exam:  General: alert, cooperative and no distress Lochia:normal flow Chest: CTAB Heart: RRR no m/r/g Abdomen: +BS, soft, nontender Uterine Fundus: firm, 2cm below umbilicus Extremities: neg edema, neg calf TTP BL, neg Homans BL  Recent Labs    05/01/19 1757 05/02/19 0439  HGB 11.5* 10.6*  HCT 34.8* 32.7*    Assessment/Plan:  ASSESSMENT: Holly Jacobs is a 24 y.o. G1P0101 s/p VAVD (FHR variability) @ [redacted]w[redacted]d, IOL for PreE. PNC c/b Rh neg, GHTN on meds (proc 60XL).   Given patient admission criteria and HA plus SRBP requiring IV meds, despite no proteinuria, will treat as a postpartum of PreE w/ SF and initiate 24hrs mag at this time. Patient is amenable to this process and understands she will still needs a BP check pp in office and is still at risk for pp PreE for up to 6wks postpartum. Otherwise, is meeting all milestones   LOS: 2 days

## 2019-05-02 NOTE — Progress Notes (Signed)
Patient feeling overall well. Had nausea during MgSO4 bolus which improved with Phenergan. Denies HA< SOB, PC, RUQ pain, LE edema. Baby in room being seen by speech for nipple adjustments.   BP range overall 143-153/82-95, outliers of 160s/100s during initiation of Mag likely 2/2 nausea. Continue to monitor BP meds. Patient aware to notify us of PreE symptoms/changes. Collecting uPC at this time

## 2019-05-02 NOTE — Lactation Note (Signed)
This note was copied from a baby's chart. Lactation Consultation Note  Patient Name: Holly Jacobs LEXNT'Z Date: 05/02/2019    [facility] student entered room at 12:32 to baby at [facility] breast on the bed. Mom said that baby had been showing signs she was hungry, and was trying to latch her. Baby was very sleepy, and was not latching at the breast.   [employer] student explained 35 week development and expectations, and suggested that Mom might need to focus on expressing milk to give to baby after every attempt at the breast until baby is a bit older and more able to suck. Parents were receptive to doing whatever they needed to do to ensure baby is healthy.   [facility] student left the room to talk to [facility], and after reviewing baby's chart with LC returned to the room to suggest that baby get supplemented with human donor milk while Mom gets ready to pump. [facility] student emphasized that the priority right now is to feed baby, but that the ultimate goal can still be exclusive feeding at the breast when baby is ready. [facility] student also emphasized that parents have not done anything wrong, and that babies born early just need a bit more help in the beginning.     [facility] student left the room at 12:45pm to get a DEBP, and would return with LC and donor milk to get baby started on supplementation.     Willa Rough Caydon Feasel 05/02/2019, 3:05 PM

## 2019-05-02 NOTE — Progress Notes (Signed)
MOB was referred for history of depression/anxiety.  * Referral screened out by Clinical Social Worker because none of the following criteria appear to apply:  ~ History of anxiety/depression during this pregnancy, or of post-partum depression following prior delivery. ~ Diagnosis of anxiety and/or depression within last 3 years. Per [facility] records, history dates back to age 24 and states patient is good now. No concerns noted. OR * MOB's symptoms currently being treated with medication and/or therapy.  Please contact the Clinical Social Worker if needs arise, by MOB request, or if MOB scores greater than 9/yes to question 10 on Edinburgh Postpartum Depression Screen.  Osamah Schmader, LCSW [facility] [phone] 

## 2019-05-02 NOTE — Lactation Note (Signed)
This note was copied from a baby's chart. Lactation Consultation Note  Patient Name: Girl Greenley Martone XMIWO'E Date: 05/02/2019  Mom & Dad agreed to using DBM. Consent signed. DBM obtained. DBM was attempted to be given with the Extra-Slow Flow nipple, but infant only consumed 1 ml. An Nfant Slow flow nipple was obtained & infant did better, consuming an additional 10 ml. Jeb Levering, SLP aware of infant & plans to come for next feeding. Romilda Joy, RN aware.    Lurline [facility] 05/02/2019, 3:18 PM

## 2019-05-03 LAB — RH IG WORKUP (INCLUDES ABO/RH)
ABO/RH(D): A NEG
Fetal Screen: NEGATIVE
Gestational Age(Wks): 35
Unit division: 0

## 2019-05-03 MED ORDER — NIFEDIPINE ER OSMOTIC RELEASE 30 MG PO TB24
90.0000 mg | ORAL_TABLET | Freq: Every day | ORAL | Status: DC
  Administered 2019-05-03 – 2019-05-04 (×2): 90 mg via ORAL
  Filled 2019-05-03 (×2): qty 3

## 2019-05-03 NOTE — Plan of Care (Signed)

## 2019-05-03 NOTE — Progress Notes (Signed)
Patient ID: Holly Jacobs, female   DOB: 1995-05-22, 24 y.o.   MRN: [id number] Pt feeling very good off magnesium. No HA at all Baby working on feeds and not able to be d/c  BP acceptable 140's/80's thus far on procardia 90mg  XL but will see if holds overnight  Possible d/c tomorrow

## 2019-05-03 NOTE — Lactation Note (Addendum)
This note was copied from a baby's chart. Lactation Consultation Note  Patient Name: Holly Jacobs [id number] Date: 05/03/2019 Reason for consult: Follow-up assessment;Primapara;1st time breastfeeding;Late-preterm 34-36.6wks;Infant < 6lbs Baby is 18 hours old  Per mom baby due to feed at 1200 .  LC offered to assist with latch. Baby wide awake and rooting,  LC 1st changed a wet diaper, spoon fed the baby 2 ml of mom EBM / tolerated well .  LC placed baby STS next to the right breast and waited for the baby to open wide and she latched for 14 mins, paused intermittently and fed in a consistent swallowing pattern with breast compressions. Baby sustained a deep latch with flanged lips the entire 14 mins. LC showed mom how to release the suction due to the baby being non - nutritive.  Mom pace fed the baby after latching with increased volumes of donor milk - 20 ml . Increased from 15 ml this am. Mom and dad aware the our goal is 30 ml by this evening.  LC reviewed sore nipple and engorgement prevention and tx , storage of breast milk,  Set up of the shells between feedings and hand pump.  Mom aware of how to use the DEBP and settings. Per mom had pumped off 10 ml this am . LC reassured mom that is a good sign milk is coming in.  Mom is off MagSo4. LC stressed to mom naps are important to let down of the milk.  Mom and da expressed they were so excited the baby latched and stayed on for the  Feeding.  LC plan -  If baby is wide awake, try an appetizer of EBM ( spoon or bottle ) and then latch.  Feed for 15 -20 mins and then supplement up to 30 ml by tonight.  Post pump after feeding for 15 -20 mins / save milk.   Addendum to [facility] plan - if the baby is awake but to sluggish to latch - just feed the supplement for the feeding , if still hungry then latch.   Both mom and dad have a good understanding of the [facility] plan.  LC praised mom for breast feeding , pumping efforts and dad for his support.   Per mom will have a DEBP at home and will F/U with Barb Carder LC next week .    Maternal Data    Feeding    LATCH Score                   Interventions Interventions: Breast feeding basics reviewed  Lactation Tools Discussed/Used     Consult Status Consult Status: Follow-up Date: 05/03/19 Follow-up type: In-patient    Matilde Sprang Denisa Enterline 05/03/2019, 12:01 PM

## 2019-05-03 NOTE — Progress Notes (Signed)
Post Partum Day 2 VAVD, Preeclampsia Subjective: no complaints, up ad lib and tolerating PO  States no HA Hoping to go home this PM  Objective: Blood pressure (!) 151/82, pulse (!) 104, temperature 98.2 F (36.8 C), temperature source Oral, resp. rate 18, height 5\' 5"  (1.651 m), weight 100.7 kg, last menstrual period 08/23/2018, SpO2 98 %, unknown if currently breastfeeding.  Physical Exam:  General: alert and cooperative Lochia: appropriate Uterine Fundus: firm   Recent Labs    05/01/19 1757 05/02/19 0439  HGB 11.5* 10.6*  HCT 34.8* 32.7*    Assessment/Plan: Pt placed on pp magnesium yesterday for HA and severe range BP's.  HA has resolved and BP 130-150/80-90's.  She is on procardia 60mg  XL and will increase to 90mg  today and d/c magnesium. D/w pt will need to monitor BP throughout day and if acceptable will consider d/c this PM Baby eating well    LOS: 3 days   07/02/19 05/03/2019, 9:39 AM

## 2019-05-04 MED ORDER — NIFEDIPINE ER OSMOTIC RELEASE 90 MG PO TB24: 90.0000 mg | ORAL_TABLET | Freq: Every day | ORAL | 1 refills | Status: DC

## 2019-05-04 MED ORDER — PRENATAL MULTIVITAMIN CH: 1.0000 | ORAL_TABLET | Freq: Every day | ORAL | 3 refills | Status: DC

## 2019-05-04 MED ORDER — IBUPROFEN 600 MG PO TABS: 600.0000 mg | ORAL_TABLET | Freq: Four times a day (QID) | ORAL | 1 refills | Status: DC

## 2019-05-04 NOTE — Progress Notes (Signed)
Post Partum Day 3 Subjective: no complaints, up ad lib, voiding, tolerating PO and nl lochia, pain controlled.  no PIH sx's, BP controlled w 90mg  Procardia  Objective: Blood pressure (!) 155/80, pulse (!) 101, temperature 98 F (36.7 C), temperature source Oral, resp. rate 18, height 5\' 5"  (1.651 m), weight 100.7 kg, last menstrual period 08/23/2018, SpO2 97 %, unknown if currently breastfeeding.  Physical Exam:  General: alert and no distress Lochia: appropriate Uterine Fundus: firm   Recent Labs    05/01/19 1757 05/02/19 0439  HGB 11.5* 10.6*  HCT 34.8* 32.7*    Assessment/Plan: Discharge home, Breastfeeding and Lactation consult.  Tolerating relative/mild anemia.  Routine PP care.  D/c with Motrin/PNV and procardia 90.  BP check in 1 week, PP check in 6 weeks.     LOS: 4 days   Holly Jacobs 05/04/2019, 9:18 AM

## 2019-05-04 NOTE — Discharge Instructions (Signed)
Postpartum Care After Vaginal Delivery This sheet gives you information about how to care for yourself from the time you deliver your baby to up to 6-12 weeks after delivery (postpartum period). Your health care provider may also give you more specific instructions. If you have problems or questions, contact your health care provider. Follow these instructions at home: Vaginal bleeding  It is normal to have vaginal bleeding (lochia) after delivery. Wear a sanitary pad for vaginal bleeding and discharge. ? During the first week after delivery, the amount and appearance of lochia is often similar to a menstrual period. ? Over the next few weeks, it will gradually decrease to a dry, yellow-brown discharge. ? For most women, lochia stops completely by 4-6 weeks after delivery. Vaginal bleeding can vary from woman to woman.  Change your sanitary pads frequently. Watch for any changes in your flow, such as: ? A sudden increase in volume. ? A change in color. ? Large blood clots.  If you pass a blood clot from your vagina, save it and call your health care provider to discuss. Do not flush blood clots down the toilet before talking with your health care provider.  Do not use tampons or douches until your health care provider says this is safe.  If you are not breastfeeding, your period should return 6-8 weeks after delivery. If you are feeding your child breast milk only (exclusive breastfeeding), your period may not return until you stop breastfeeding. Perineal care  Keep the area between the vagina and the anus (perineum) clean and dry as told by your health care provider. Use medicated pads and pain-relieving sprays and creams as directed.  If you had a cut in the perineum (episiotomy) or a tear in the vagina, check the area for signs of infection until you are healed. Check for: ? More redness, swelling, or pain. ? Fluid or blood coming from the cut or tear. ? Warmth. ? Pus or a bad  smell.  You may be given a squirt bottle to use instead of wiping to clean the perineum area after you go to the bathroom. As you start healing, you may use the squirt bottle before wiping yourself. Make sure to wipe gently.  To relieve pain caused by an episiotomy, a tear in the vagina, or swollen veins in the anus (hemorrhoids), try taking a warm sitz bath 2-3 times a day. A sitz bath is a warm water bath that is taken while you are sitting down. The water should only come up to your hips and should cover your buttocks. Breast care  Within the first few days after delivery, your breasts may feel heavy, full, and uncomfortable (breast engorgement). Milk may also leak from your breasts. Your health care provider can suggest ways to help relieve the discomfort. Breast engorgement should go away within a few days.  If you are breastfeeding: ? Wear a bra that supports your breasts and fits you well. ? Keep your nipples clean and dry. Apply creams and ointments as told by your health care provider. ? You may need to use breast pads to absorb milk that leaks from your breasts. ? You may have uterine contractions every time you breastfeed for up to several weeks after delivery. Uterine contractions help your uterus return to its normal size. ? If you have any problems with breastfeeding, work with your health care provider or lactation consultant.  If you are not breastfeeding: ? Avoid touching your breasts a lot. Doing this can make   your breasts produce more milk. ? Wear a good-fitting bra and use cold packs to help with swelling. ? Do not squeeze out (express) milk. This causes you to make more milk. Intimacy and sexuality  Ask your health care provider when you can engage in sexual activity. This may depend on: ? Your risk of infection. ? How fast you are healing. ? Your comfort and desire to engage in sexual activity.  You are able to get pregnant after delivery, even if you have not had  your period. If desired, talk with your health care provider about methods of birth control (contraception). Medicines  Take over-the-counter and prescription medicines only as told by your health care provider.  If you were prescribed an antibiotic medicine, take it as told by your health care provider. Do not stop taking the antibiotic even if you start to feel better. Activity  Gradually return to your normal activities as told by your health care provider. Ask your health care provider what activities are safe for you.  Rest as much as possible. Try to rest or take a nap while your baby is sleeping. Eating and drinking   Drink enough fluid to keep your urine pale yellow.  Eat high-fiber foods every day. These may help prevent or relieve constipation. High-fiber foods include: ? Whole grain cereals and breads. ? Brown rice. ? Beans. ? Fresh fruits and vegetables.  Do not try to lose weight quickly by cutting back on calories.  Take your prenatal vitamins until your postpartum checkup or until your health care provider tells you it is okay to stop. Lifestyle  Do not use any products that contain nicotine or tobacco, such as cigarettes and e-cigarettes. If you need help quitting, ask your health care provider.  Do not drink alcohol, especially if you are breastfeeding. General instructions  Keep all follow-up visits for you and your baby as told by your health care provider. Most women visit their health care provider for a postpartum checkup within the first 3-6 weeks after delivery. Contact a health care provider if:  You feel unable to cope with the changes that your child brings to your life, and these feelings do not go away.  You feel unusually sad or worried.  Your breasts become red, painful, or hard.  You have a fever.  You have trouble holding urine or keeping urine from leaking.  You have little or no interest in activities you used to enjoy.  You have not  breastfed at all and you have not had a menstrual period for 12 weeks after delivery.  You have stopped breastfeeding and you have not had a menstrual period for 12 weeks after you stopped breastfeeding.  You have questions about caring for yourself or your baby.  You pass a blood clot from your vagina. Get help right away if:  You have chest pain.  You have difficulty breathing.  You have sudden, severe leg pain.  You have severe pain or cramping in your lower abdomen.  You bleed from your vagina so much that you fill more than one sanitary pad in one hour. Bleeding should not be heavier than your heaviest period.  You develop a severe headache.  You faint.  You have blurred vision or spots in your vision.  You have bad-smelling vaginal discharge.  You have thoughts about hurting yourself or your baby. If you ever feel like you may hurt yourself or others, or have thoughts about taking your own life, get help  right away. You can go to the nearest emergency department or call:  Your local emergency services (911 in the [place]  A suicide crisis helpline, such as the National Suicide Prevention Lifeline at 916-553-9421. This is open 24 hours a day. Summary  The period of time right after you deliver your newborn up to 6-12 weeks after delivery is called the postpartum period.  Gradually return to your normal activities as told by your health care provider.  Keep all follow-up visits for you and your baby as told by your health care provider. This information is not intended to replace advice given to you by your health care provider. Make sure you discuss any questions you have with your health care provider. Document Revised: 02/11/2017 Document Reviewed: 11/22/2016 Elsevier Patient Education  2020 [employer] Preeclampsia and Eclampsia Preeclampsia is a serious condition that may develop during pregnancy. This condition causes high blood pressure and increased protein  in your urine along with other symptoms, such as headaches and vision changes. These symptoms may develop as the condition gets worse. Preeclampsia may occur at 20 weeks of pregnancy or later. Diagnosing and treating preeclampsia early is very important. If not treated early, it can cause serious problems for you and your baby. One problem it can lead to is eclampsia. Eclampsia is a condition that causes muscle jerking or shaking (convulsions or seizures) and other serious problems for the mother. During pregnancy, delivering your baby may be the best treatment for preeclampsia or eclampsia. For most women, preeclampsia and eclampsia symptoms go away after giving birth. In rare cases, a woman may develop preeclampsia after giving birth (postpartum preeclampsia). This usually occurs within 48 hours after childbirth but may occur up to 6 weeks after giving birth. What are the causes? The cause of preeclampsia is not known. What increases the risk? The following risk factors make you more likely to develop preeclampsia:  Being pregnant for the first time.  Having had preeclampsia during a past pregnancy.  Having a family history of preeclampsia.  Having high blood pressure.  Being pregnant with more than one baby.  Being 69 or older.  Being African-American.  Having kidney disease or diabetes.  Having medical conditions such as lupus or blood diseases.  Being very overweight (obese). What are the signs or symptoms? The most common symptoms are:  Severe headaches.  Vision problems, such as blurred or double vision.  Abdominal pain, especially upper abdominal pain. Other symptoms that may develop as the condition gets worse include:  Sudden weight gain.  Sudden swelling of the hands, face, legs, and feet.  Severe nausea and vomiting.  Numbness in the face, arms, legs, and feet.  Dizziness.  Urinating less than usual.  Slurred speech.  Convulsions or seizures. How is  this diagnosed? There are no screening tests for preeclampsia. Your health care provider will ask you about symptoms and check for signs of preeclampsia during your prenatal visits. You may also have tests that include:  Checking your blood pressure.  Urine tests to check for protein. Your health care provider will check for this at every prenatal visit.  Blood tests.  Monitoring your baby's heart rate.  Ultrasound. How is this treated? You and your health care provider will determine the treatment approach that is best for you. Treatment may include:  Having more frequent prenatal exams to check for signs of preeclampsia, if you have an increased risk for preeclampsia.  Medicine to lower your blood pressure.  Staying in the  hospital, if your condition is severe. There, treatment will focus on controlling your blood pressure and the amount of fluids in your body (fluid retention).  Taking medicine (magnesium sulfate) to prevent seizures. This may be given as an injection or through an IV.  Taking a low-dose aspirin during your pregnancy.  Delivering your baby early. You may have your labor started with medicine (induced), or you may have a cesarean delivery. Follow these instructions at home: Eating and drinking   Drink enough fluid to keep your urine pale yellow.  Avoid caffeine. Lifestyle  Do not use any products that contain nicotine or tobacco, such as cigarettes and e-cigarettes. If you need help quitting, ask your health care provider.  Do not use alcohol or drugs.  Avoid stress as much as possible. Rest and get plenty of sleep. General instructions  Take over-the-counter and prescription medicines only as told by your health care provider.  When lying down, lie on your left side. This keeps pressure off your major blood vessels.  When sitting or lying down, raise (elevate) your feet. Try putting some pillows underneath your lower legs.  Exercise regularly. Ask  your health care provider what kinds of exercise are best for you.  Keep all follow-up and prenatal visits as told by your health care provider. This is important. How is this prevented? There is no known way of preventing preeclampsia or eclampsia from developing. However, to lower your risk of complications and detect problems early:  Get regular prenatal care. Your health care provider may be able to diagnose and treat the condition early.  Maintain a healthy weight. Ask your health care provider for help managing weight gain during pregnancy.  Work with your health care provider to manage any long-term (chronic) health conditions you have, such as diabetes or kidney problems.  You may have tests of your blood pressure and kidney function after giving birth.  Your health care provider may have you take low-dose aspirin during your next pregnancy. Contact a health care provider if:  You have symptoms that your health care provider told you may require more treatment or monitoring, such as: ? Headaches. ? Nausea or vomiting. ? Abdominal pain. ? Dizziness. ? Light-headedness. Get help right away if:  You have severe: ? Abdominal pain. ? Headaches that do not get better. ? Dizziness. ? Vision problems. ? Confusion. ? Nausea or vomiting.  You have any of the following: ? A seizure. ? Sudden, rapid weight gain. ? Sudden swelling in your hands, ankles, or face. ? Trouble moving any part of your body. ? Numbness in any part of your body. ? Trouble speaking. ? Abnormal bleeding.  You faint. Summary  Preeclampsia is a serious condition that may develop during pregnancy.  This condition causes high blood pressure and increased protein in your urine along with other symptoms, such as headaches and vision changes.  Diagnosing and treating preeclampsia early is very important. If not treated early, it can cause serious problems for you and your baby.  Get help right away if  you have symptoms that your health care provider told you to watch for. This information is not intended to replace advice given to you by your health care provider. Make sure you discuss any questions you have with your health care provider. Document Revised: 10/11/2017 Document Reviewed: 09/15/2015 Elsevier Patient Education  2020 [employer].

## 2019-05-04 NOTE — Lactation Note (Signed)
This note was copied from a baby's chart. Lactation Consultation Note  Patient Name: Holly Jacobs [id number] Date: 05/04/2019 Reason for consult: Follow-up assessment;1st time breastfeeding;Late-preterm 34-36.6wks;Infant < 6lbs;Infant weight loss  Baby is 80 hours old and for D/C  Increased weight.  Per  Mom since yesterday has latched at 1230 for 10 mins other wise has  Received donor milk in a bottle.  LC reviewed the [facility] plan from yesterday and stressed the importance of feeding the  Baby with feeding cues and by 3 hours. If the baby is wide awake work on latching 15 -20 mins mins and then supplement. If not try the appetizer 1st and then latch or feed the entire supplement and then latch.  Sore nipple and engorgement prevention and tx reviewed, storage of breast milk.  Mom and dad aware of the benefits of STS feedings and in between.  Per mom has made appt for Monday with Barb Carder at [facility] office for [facility] F/U.  Mom also has the [facility] pamphlet with phone number and is aware she can call with breast feeding questions.    Maternal Data Has patient been taught Hand Expression?: Yes  Feeding Feeding Type: Donor Breast Milk Nipple Type: Dr. Levert Feinstein Preemie  LATCH Score                   Interventions Interventions: Breast feeding basics reviewed  Lactation Tools Discussed/Used Tools: Pump;Shells Shell Type: Inverted Breast pump type: Double-Electric Breast Pump;Manual   Consult Status Consult Status: Follow-up Date: (per mom Darlin Priestly [facility] @ [address] - [facility]) Follow-up type: Out-patient    Matilde Sprang Halie Gass 05/04/2019, 10:31 AM

## 2019-05-04 NOTE — Discharge Summary (Signed)
OB Discharge Summary     Patient Name: Holly Jacobs DOB: [birth year] MRN: [id number]  Date of admission: 04/30/2019 Delivering MD: Jackelyn Knife, TODD   Date of discharge: 05/04/2019  Admitting diagnosis: Indication for care in labor or delivery [O75.9] Intrauterine pregnancy: [redacted]w[redacted]d     Secondary diagnosis:  Active Problems:   Indication for care in labor or delivery   SVD (spontaneous vaginal delivery)  Additional problems: PIH, mild PP anemia     Discharge diagnosis: Preterm Pregnancy Delivered                                                                                                Post partum procedures:rhogam  Augmentation: AROM, Pitocin, Cytotec and Foley Balloon  Complications: None  Hospital course:  Induction of Labor With Vaginal Delivery   24 y.o. yo G1P0101 at [redacted]w[redacted]d was admitted to the hospital 04/30/2019 for induction of labor.  Indication for induction: Gestational hypertension.  Patient had an uncomplicated labor course as follows: Membrane Rupture Time/Date: 7:37 AM ,05/01/2019   Intrapartum Procedures: Episiotomy: None [1]                                         Lacerations:  1st degree [2];Vaginal [6];Labial [10]  Patient had delivery of a Viable infant.  Information for the patient's newborn:  Annsley, Akkerman [[id number]]  Delivery Method: Vag-Vacuum    05/01/2019  Details of delivery can be found in separate delivery note.  Patient had a routine postpartum course. Patient is discharged home 05/04/19.  Physical exam  Vitals:   05/03/19 1928 05/04/19 0017 05/04/19 0429 05/04/19 0740  BP: (!) 147/78 (!) 139/92 (!) 147/89 (!) 155/80  Pulse: (!) 114 98 (!) 103 (!) 101  Resp: 18 20 18 18   Temp: 98 F (36.7 C) 98.6 F (37 C) 98 F (36.7 C) 98 F (36.7 C)  TempSrc: Oral Oral Oral Oral  SpO2: 100%   97%  Weight:      Height:       General: alert and no distress Lochia: appropriate Uterine Fundus: firm  Labs: Lab Results  Component Value  Date   WBC 12.1 (H) 05/02/2019   HGB 10.6 (L) 05/02/2019   HCT 32.7 (L) 05/02/2019   MCV 92.4 05/02/2019   PLT 218 05/02/2019   CMP Latest Ref Rng & Units 05/02/2019  Glucose 70 - 99 mg/dL 07/02/2019)  BUN 6 - 20 mg/dL 8  Creatinine 419(Q - 2.22 mg/dL 9.79  Sodium 8.92 - 119 mmol/L 138  Potassium 3.5 - 5.1 mmol/L 3.2(L)  Chloride 98 - 111 mmol/L 107  CO2 22 - 32 mmol/L 22  Calcium 8.9 - 10.3 mg/dL 417)  Total Protein 6.5 - 8.1 g/dL 4.9(L)  Total Bilirubin 0.3 - 1.2 mg/dL 0.4  Alkaline Phos 38 - 126 U/L 98  AST 15 - 41 U/L 28  ALT 0 - 44 U/L 14    Discharge instruction: per After Visit Summary and "Baby and Me Booklet".  After visit  meds:  Allergies as of 05/04/2019      Reactions   Imitrex [sumatriptan Base] Anaphylaxis, Shortness Of Breath, Itching   Sulfa Antibiotics Anaphylaxis   Latex Hives   Tramadol Hives      Medication List    STOP taking these medications   aspirin EC 81 MG tablet     TAKE these medications   acetaminophen 500 MG tablet Commonly known as: TYLENOL Take 1,000 mg by mouth every 6 (six) hours as needed for mild pain or headache.   calcium carbonate 500 MG chewable tablet Commonly known as: TUMS - dosed in mg elemental calcium Chew 2 tablets by mouth daily as needed for indigestion or heartburn.   cyclobenzaprine 10 MG tablet Commonly known as: FLEXERIL Take 10 mg by mouth 3 (three) times daily as needed for muscle spasms.   ferrous sulfate 325 (65 FE) MG tablet Take 325 mg by mouth every other day.   ibuprofen 600 MG tablet Commonly known as: ADVIL Take 1 tablet (600 mg total) by mouth every 6 (six) hours.   NIFEdipine 90 MG 24 hr tablet Commonly known as: PROCARDIA XL/NIFEDICAL-XL Take 1 tablet (90 mg total) by mouth daily. What changed:   medication strength  how much to take   prenatal multivitamin Tabs tablet Take 1 tablet by mouth daily at 12 noon.   promethazine 25 MG tablet Commonly known as: PHENERGAN Take 1 tablet (25  mg total) by mouth every 6 (six) hours as needed for nausea or vomiting.       Diet: routine diet  Activity: Advance as tolerated. Pelvic rest for 6 weeks.   Outpatient follow up:1 and 6 weeks Follow up Appt:No future appointments. Follow up Visit:No follow-ups on file.  Postpartum contraception: Undecided  Newborn Data: Live born female  Birth Weight: 5 lb 7 oz (2466 g) APGAR: 49, 9  Newborn Delivery   Birth date/time: 05/01/2019 16:13:00 Delivery type: Vaginal, Vacuum (Extractor)      Baby Feeding: Breast Disposition:home with mother   05/04/2019 Janyth Contes, MD

## 2019-05-04 NOTE — Progress Notes (Signed)
Discharge instructions and prescriptions given to pt. Discussed post vaginal delivery care, signs and symptoms to report to the MD, upcoming appointments, and meds. Pt verbalizes understanding and has no questions or concerns at this time. Pt discharged with baby in stable condition. 

## 2019-05-10 ENCOUNTER — Inpatient Hospital Stay (HOSPITAL_COMMUNITY)
Admission: AD | Admit: 2019-05-10 | Payer: Medicaid Other | Source: Home / Self Care | Admitting: Obstetrics and Gynecology

## 2019-05-10 ENCOUNTER — Inpatient Hospital Stay (HOSPITAL_COMMUNITY): Payer: Medicaid Other

## 2019-05-30 ENCOUNTER — Encounter (HOSPITAL_COMMUNITY): Payer: Medicaid Other

## 2019-09-22 IMAGING — US OBSTETRIC <14 WK US AND TRANSVAGINAL OB US
1 series · 15 of 28 positions shown · non-contrast
Comparison: None.

CLINICAL DATA: Pelvic pain

EXAM:
OBSTETRIC <14 WK US AND TRANSVAGINAL OB US
TECHNIQUE: Both transabdominal and transvaginal ultrasound examinations were
performed for complete evaluation of the gestation as well as the
maternal uterus, adnexal regions, and pelvic cul-de-sac.
Transvaginal technique was performed to assess early pregnancy.

[Series 1: obstetric <14 wk us and transvaginal ob us · 36 acquisitions, 15 frames shown]
[im 1/36]
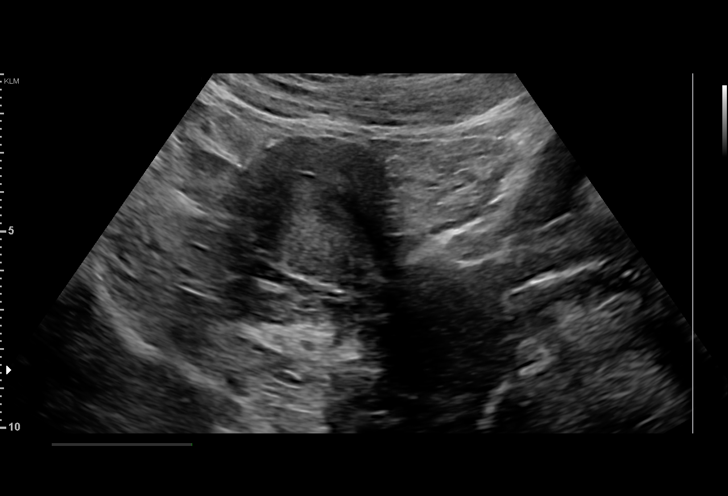
[im 3/36]
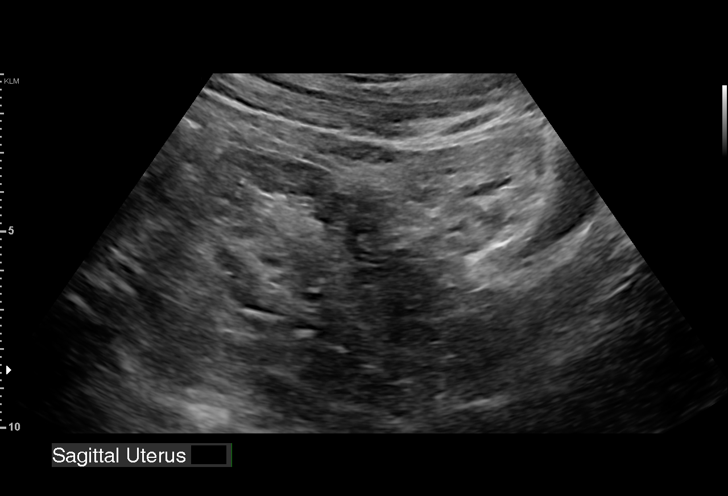
[im 6/36]
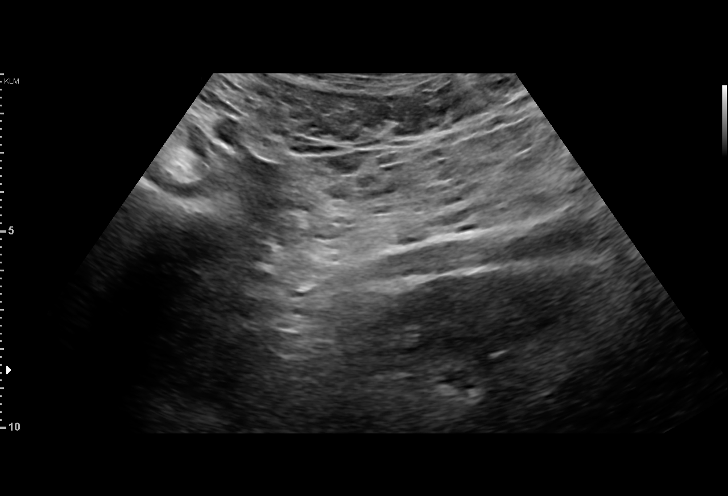
[im 8/36]
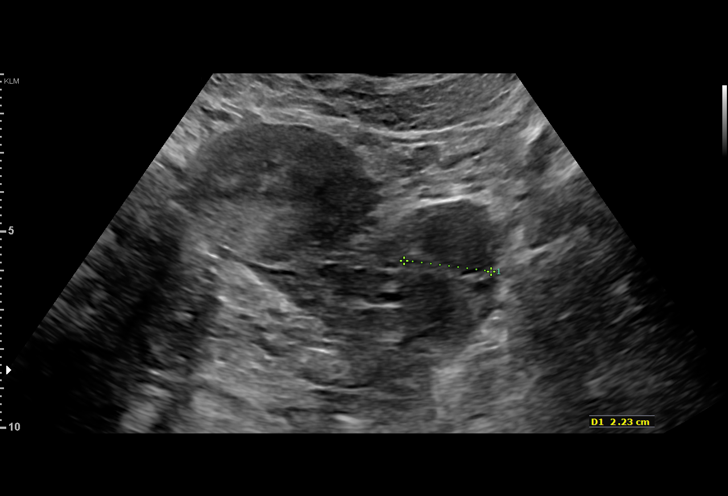
[im 11/36]
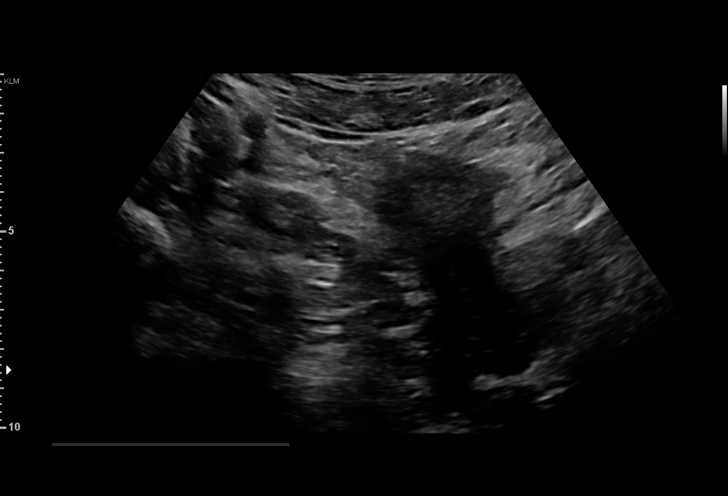
[im 13/36]
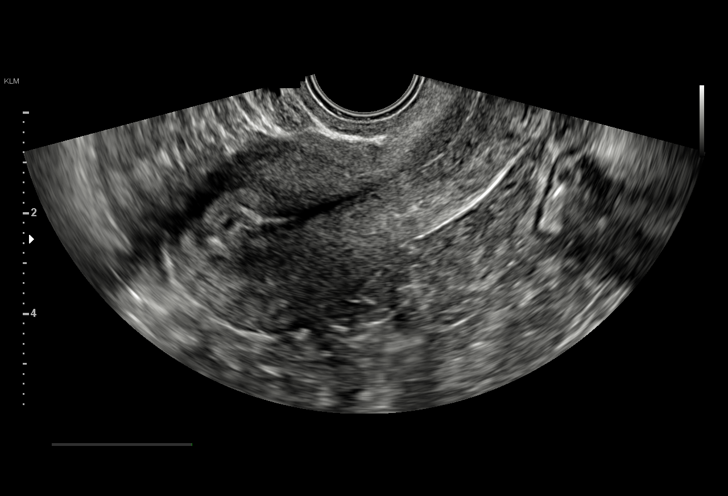
[im 16/36]
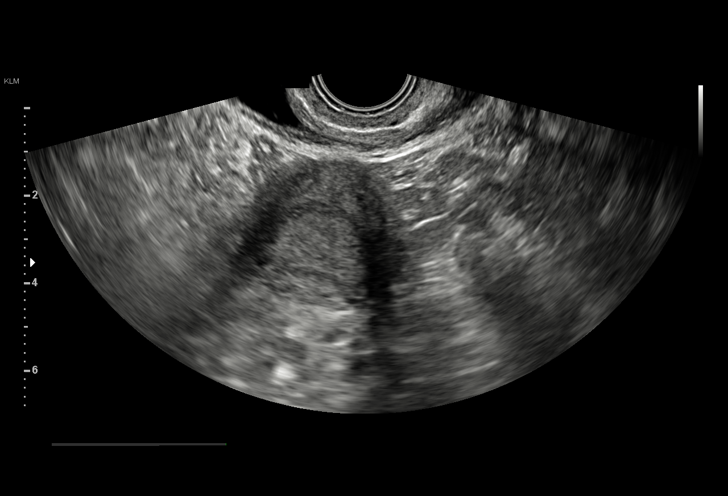
[im 19/36]
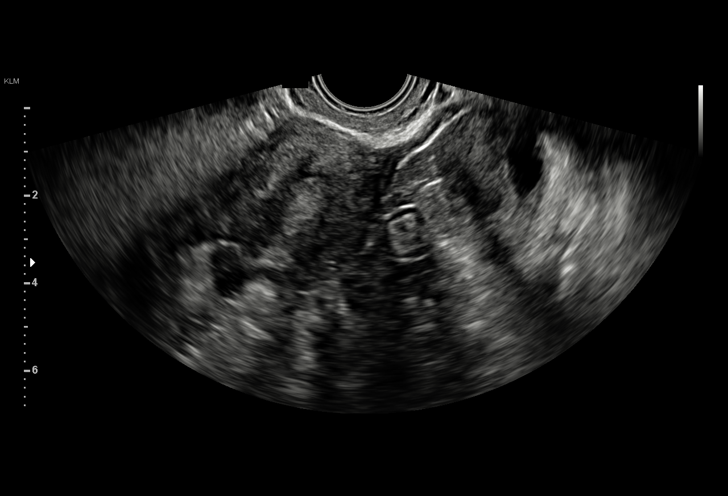
[im 20/36]
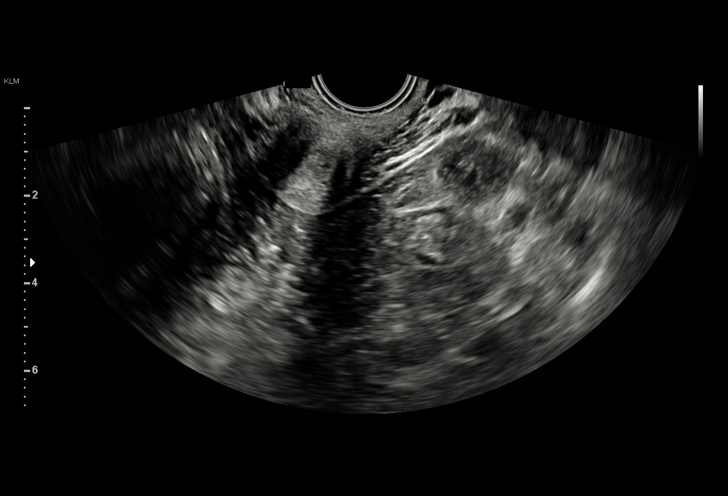
[im 23/36]
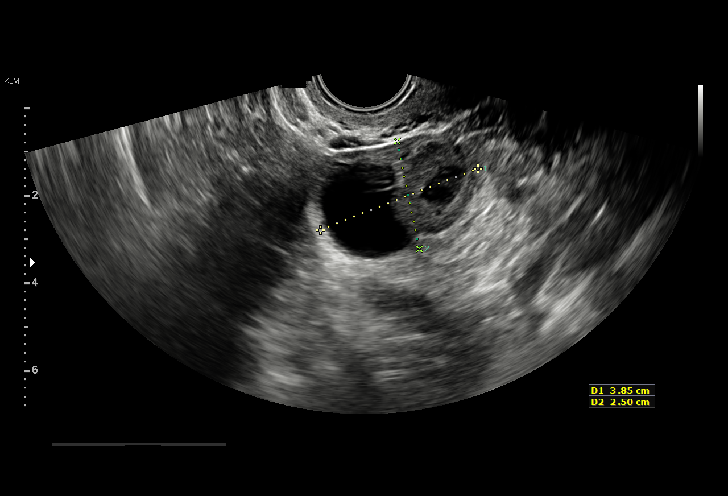
[im 25/36]
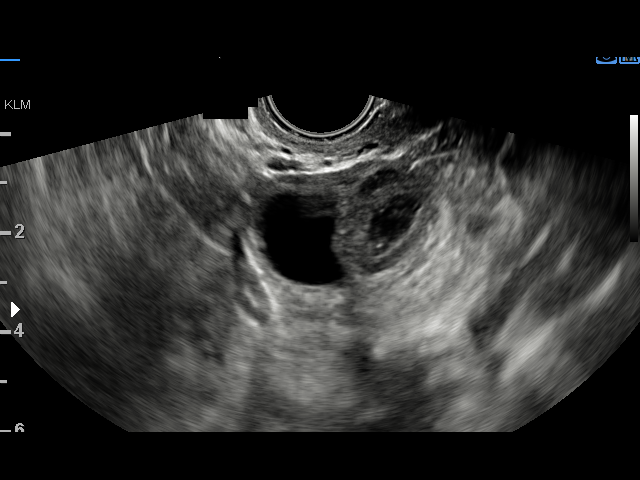
[im 28/36]
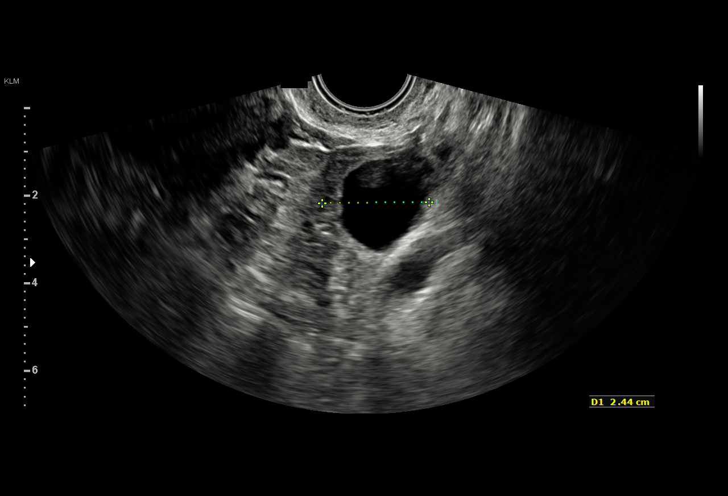
[im 30/36]
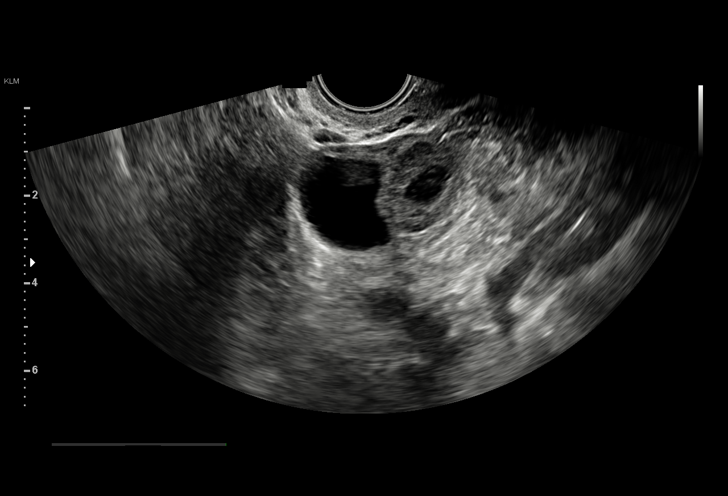
[im 33/36]
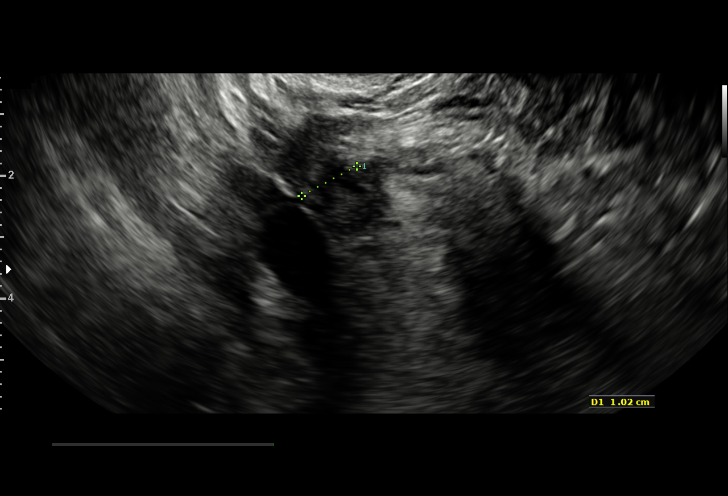
[im 36/36]
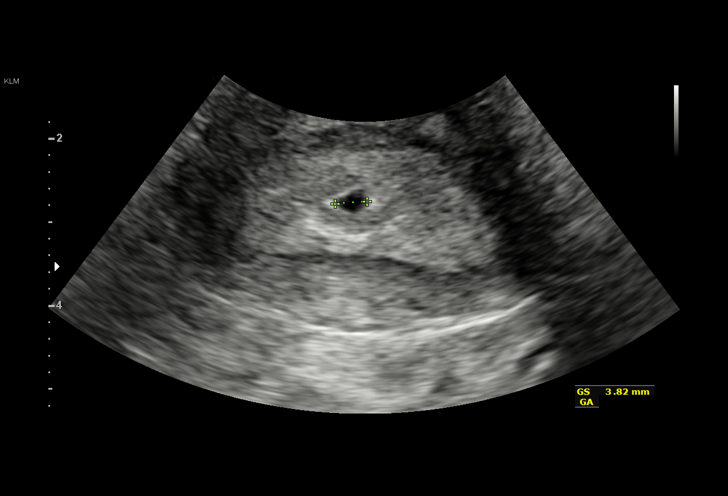

[15 of 28 positions shown; findings below may reference images not displayed]

FINDINGS: Intrauterine gestational sac: Visualized

Yolk sac:  Not visualized

Embryo:  Not visualized

Cardiac Activity: Not visualized

MSD: 3 mm   5 w   0 d

Subchorionic hemorrhage:  None visualized.

Maternal uterus/adnexae: Cervical os closed. Right ovary measures
2.1 x 1.4 x 1.0 cm. Left ovary measures 3.9 x 2.5 x 2.8 cm. There is
an approximately 2 cm corpus luteum on the right. No free pelvic
fluid.
IMPRESSION: Probable early intrauterine gestational sac, but no yolk sac, fetal
pole, or cardiac activity yet visualized. Recommend follow-up
quantitative B-HCG levels and follow-up US in 14 days to assess
viability. This recommendation follows SRU consensus guidelines:
Diagnostic Criteria for Nonviable Pregnancy Early in the First
Trimester. N Engl J Med 2564; [DATE]. based on gestational sac
size, estimated gestational age is approximately 5 weeks.

Study otherwise unremarkable.

## 2019-10-08 IMAGING — US TRANSVAGINAL OB ULTRASOUND
1 series · 15 of 28 positions shown · non-contrast
Comparison: 09/25/2018

CLINICAL DATA: Assess viability and pregnancy location.

EXAM:
TRANSVAGINAL OB ULTRASOUND
TECHNIQUE: Transvaginal ultrasound was performed for complete evaluation of the
gestation as well as the maternal uterus, adnexal regions, and
pelvic cul-de-sac.

[Series 1: transvaginal ob ultrasound · 36 acquisitions, 15 frames shown]
[im 1/36]
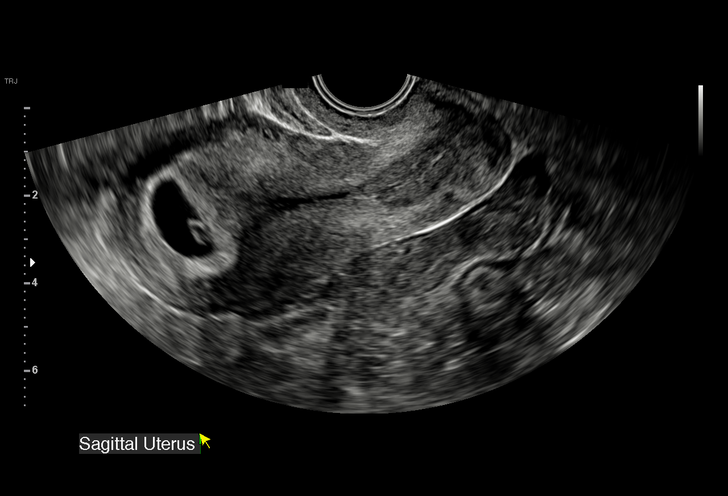
[im 3/36]
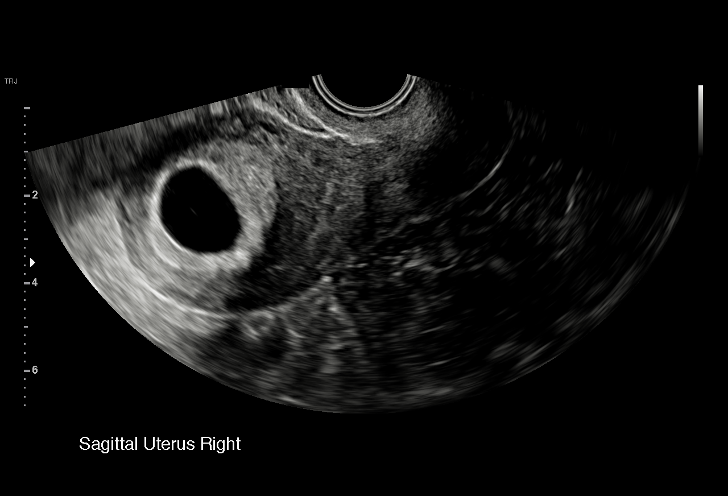
[im 6/36]
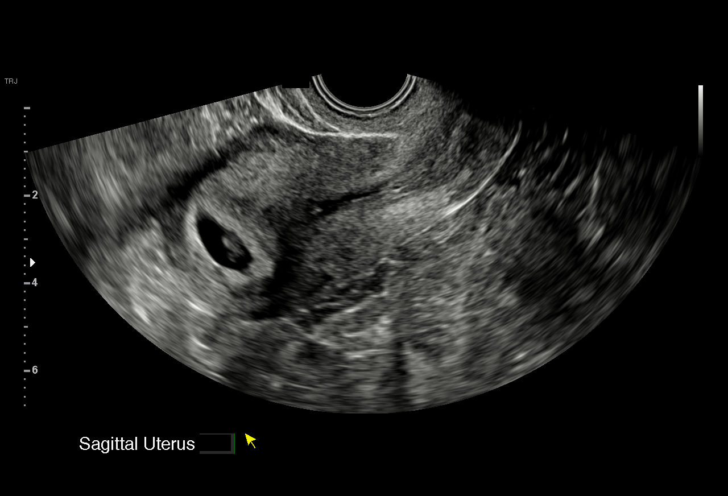
[im 8/36]
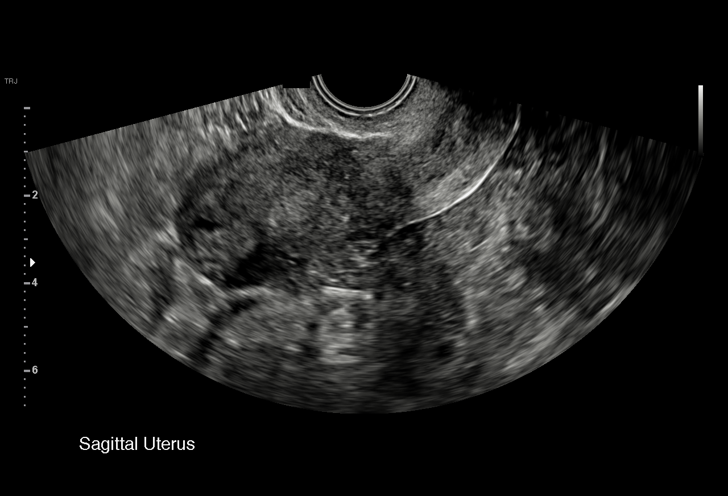
[im 11/36]
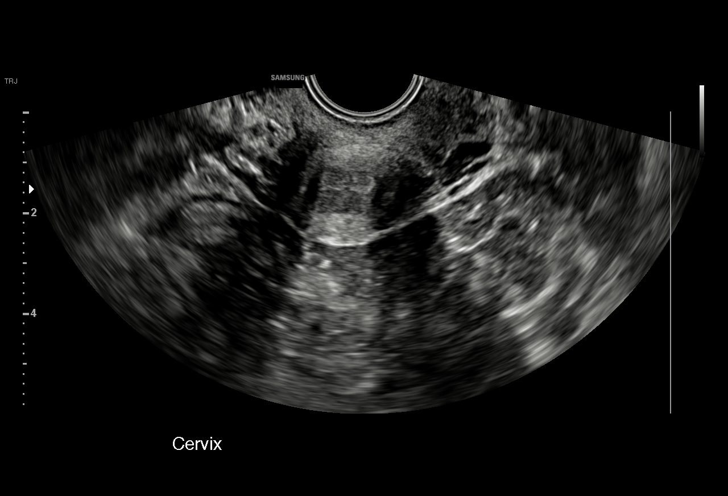
[im 13/36]
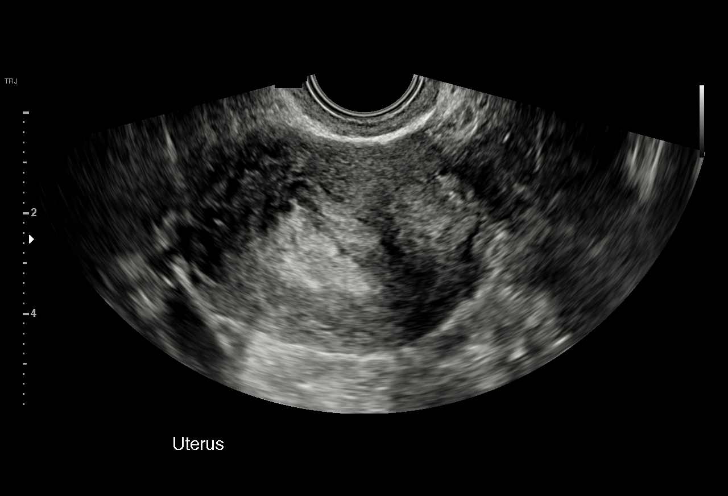
[im 16/36]
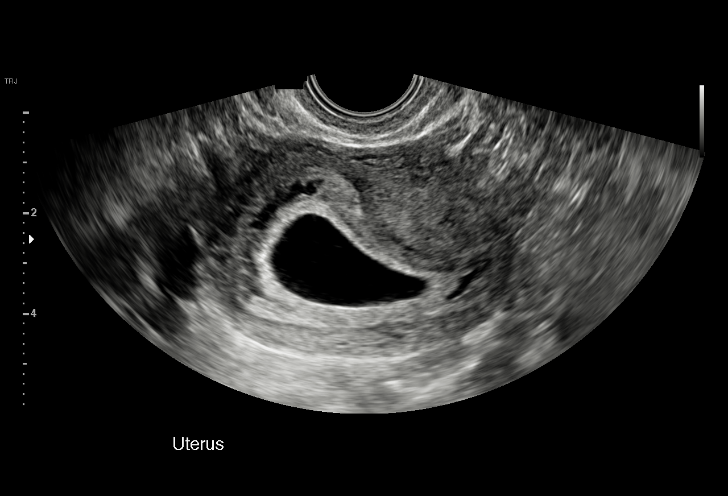
[im 19/36]
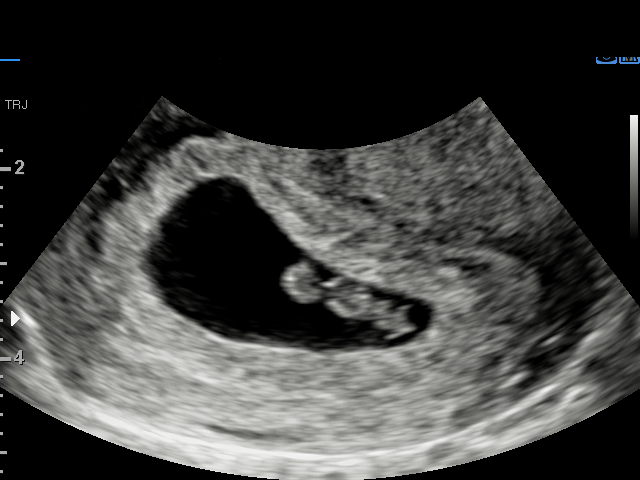
[im 20/36]
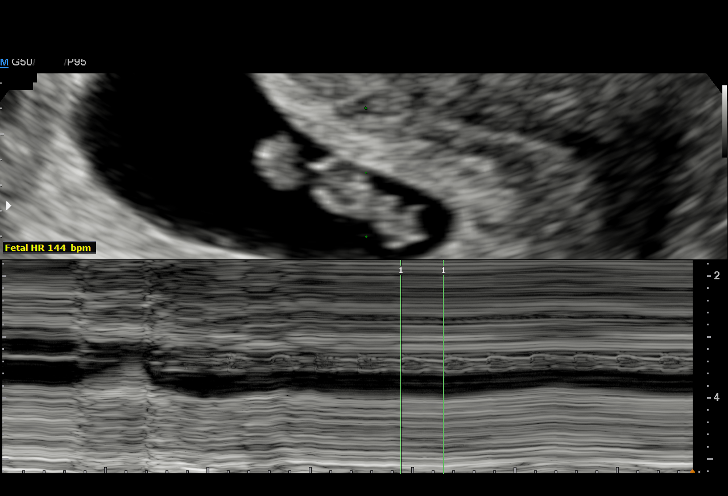
[im 23/36]
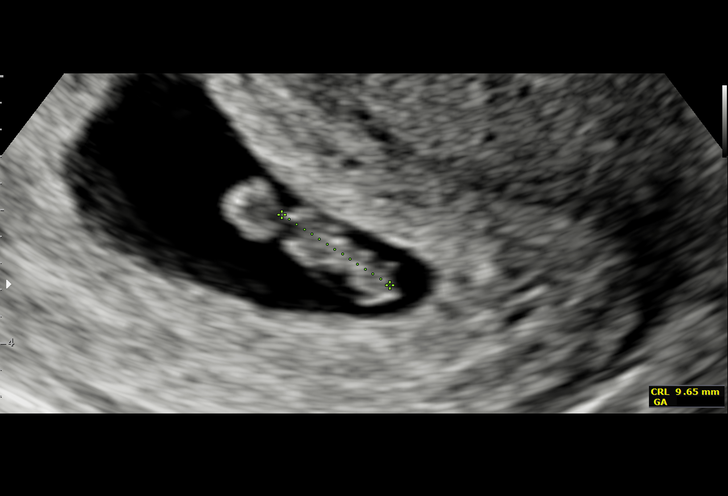
[im 25/36]
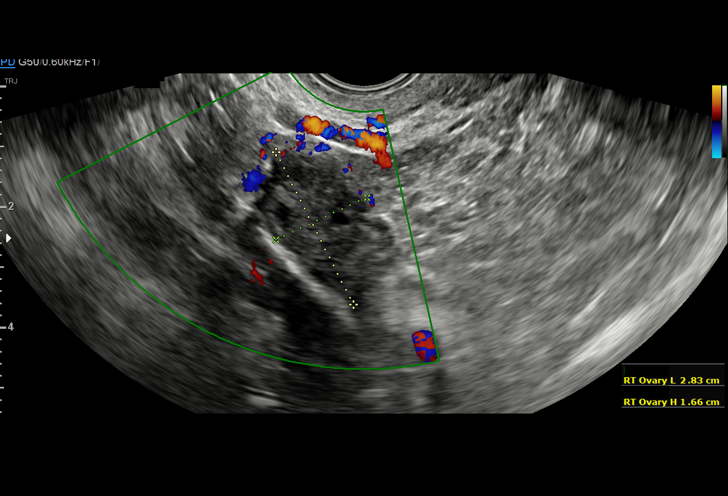
[im 28/36]
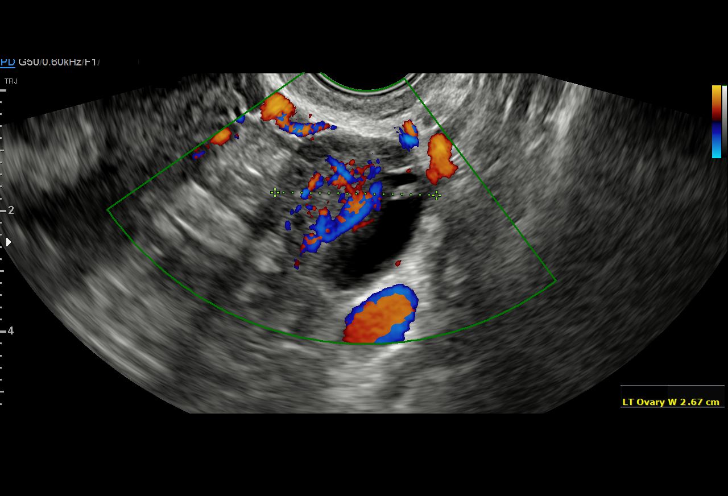
[im 30/36]
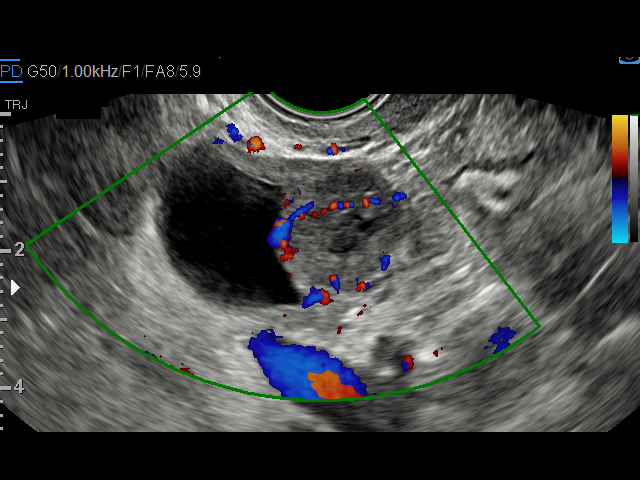
[im 33/36]
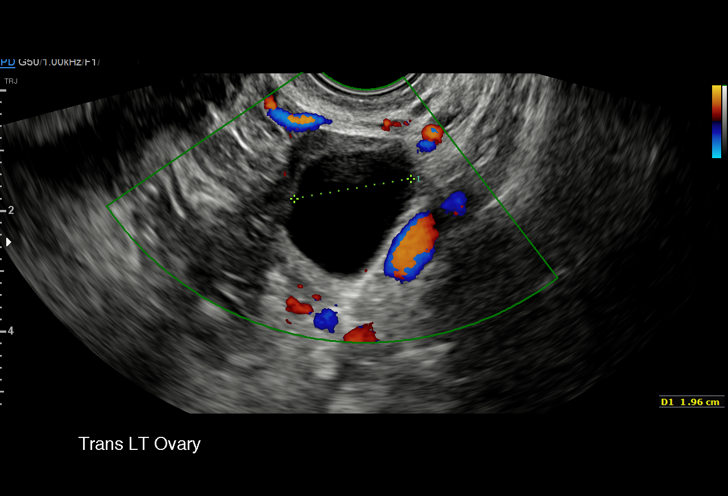
[im 36/36]
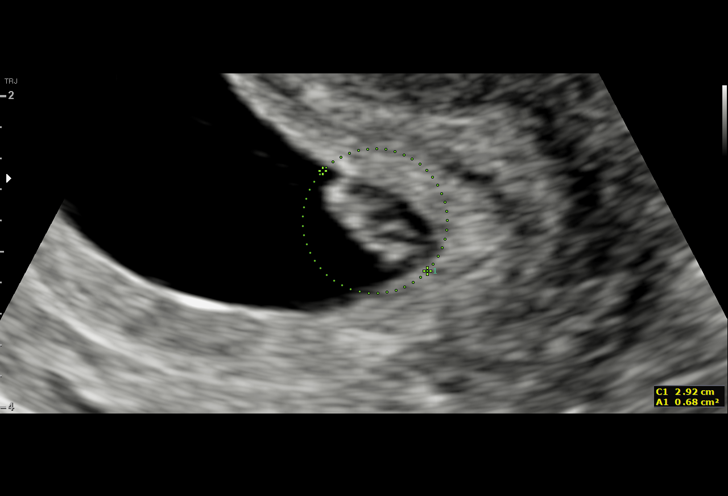

[15 of 28 positions shown; findings below may reference images not displayed]

FINDINGS: Intrauterine gestational sac: Single

Yolk sac:  Visualized.

Embryo:  Visualized.

Cardiac Activity: Visualized.

Heart Rate: 144 bpm

CRL:   9.5 mm   7 w 0 d                  US EDC: 05/30/2019

Maternal uterus/adnexae:

Subchorionic hemorrhage: None visualized

Right ovary: Normal

Left ovary: Corpus luteum and 2.8 cm simple appearing cyst noted.

Other :None

Free fluid:  Trace free fluid identified.
IMPRESSION: 1. Single living intrauterine gestation with an estimated
gestational age of 7 weeks and 0 days.

## 2020-02-23 NOTE — L&D Delivery Note (Signed)
Operative Delivery Note She progressed to complete and pushed for an hour.  At 7:06 PM a viable female was delivered via Vaginal, Spontaneous.  Presentation: vertex; Position: Occiput,, Posterior; Station: +2.  Verbal consent: obtained from patient.  Risks and benefits discussed in detail.  Risks include, but are not limited to the risks of anesthesia, bleeding, infection, damage to maternal tissues, fetal cephalhematoma.  There is also the risk of inability to effect vaginal delivery of the head, or shoulder dystocia that cannot be resolved by established maneuvers, leading to the need for emergency cesarean section.  Vacuum assistance offered due to maternal discomfort after pushing for an hour.  I suspected OP position, placed mushroom cup as posterior as possible.  Vtx delivered with vacuum in green zone pulling with 2 ctx.  Vacuum cup was just superior to forehead  APGAR: 8, 9; weight pending   Placenta status: spontaneous, intact.   Cord:  with the following complications: none.   Anesthesia:  Epidural Instruments: mushroom cup Episiotomy: None Lacerations: 1st degree;Perineal Suture Repair: 3.0 vicryl rapide Est. Blood Loss (mL):  100  Mom to postpartum.  Baby to Couplet care / Skin to Skin.  Holly Jacobs Holly Jacobs 09/13/2020, 7:30 PM

## 2020-02-29 LAB — OB RESULTS CONSOLE VARICELLA ZOSTER ANTIBODY, IGG: Varicella: IMMUNE

## 2020-02-29 LAB — OB RESULTS CONSOLE RUBELLA ANTIBODY, IGM: Rubella: IMMUNE

## 2020-02-29 LAB — OB RESULTS CONSOLE HEPATITIS B SURFACE ANTIGEN: Hepatitis B Surface Ag: NEGATIVE

## 2020-04-15 IMAGING — US US MFM FETAL BPP W/O NON-STRESS
1 series · 12 of 20 positions shown · non-contrast
Comparison: none

[Series 1: us mfm fetal bpp w/o non-stress · 20 acquisitions, 12 frames shown]
[im 1/20]
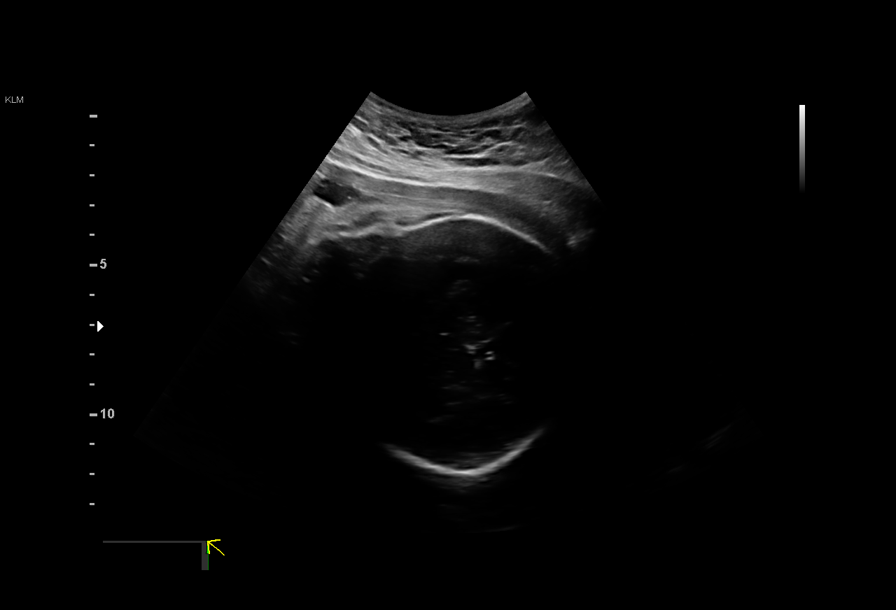
[im 3/20]
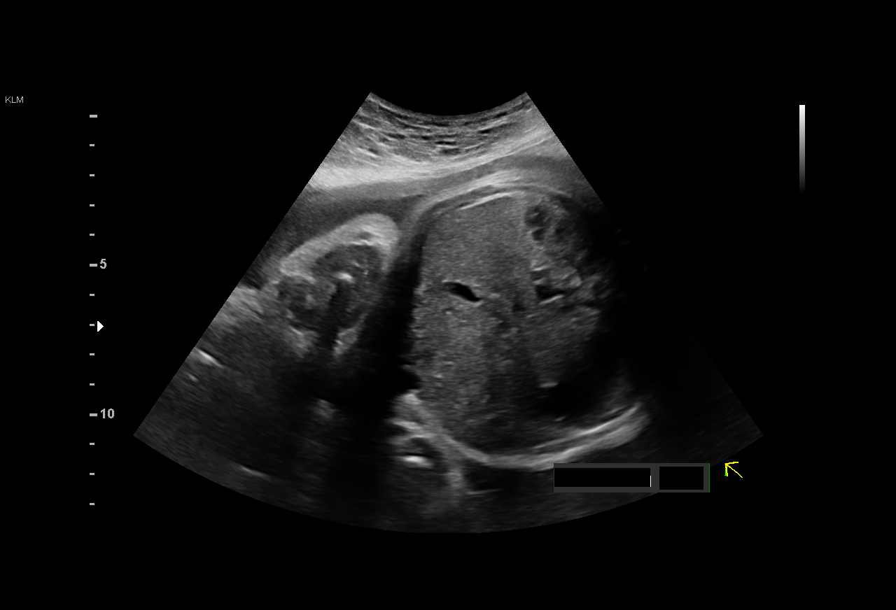
[im 5/20]
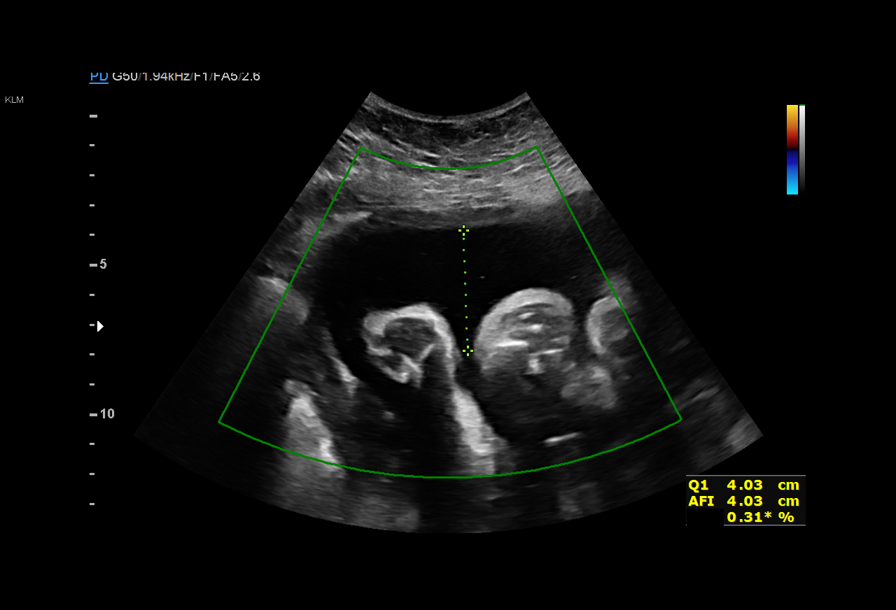
[im 6/20]
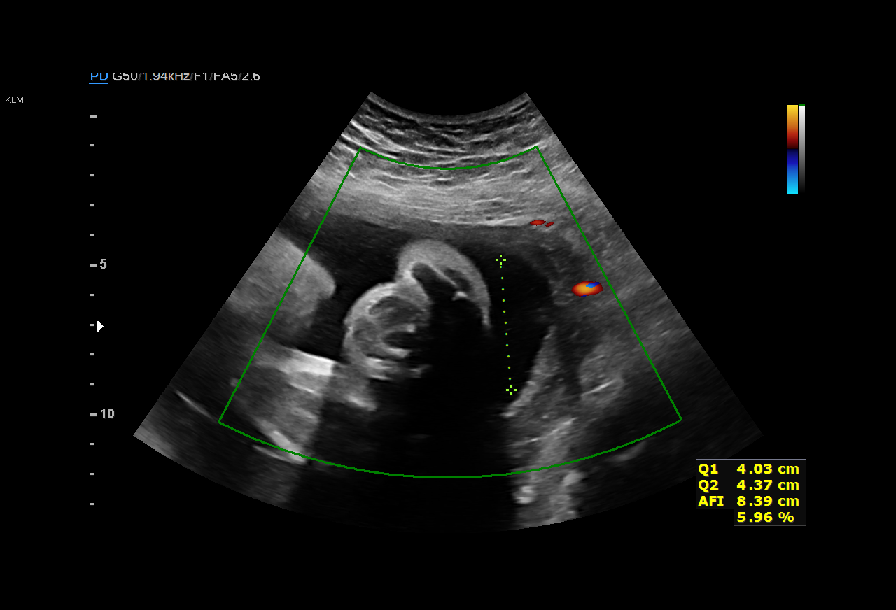
[im 8/20]
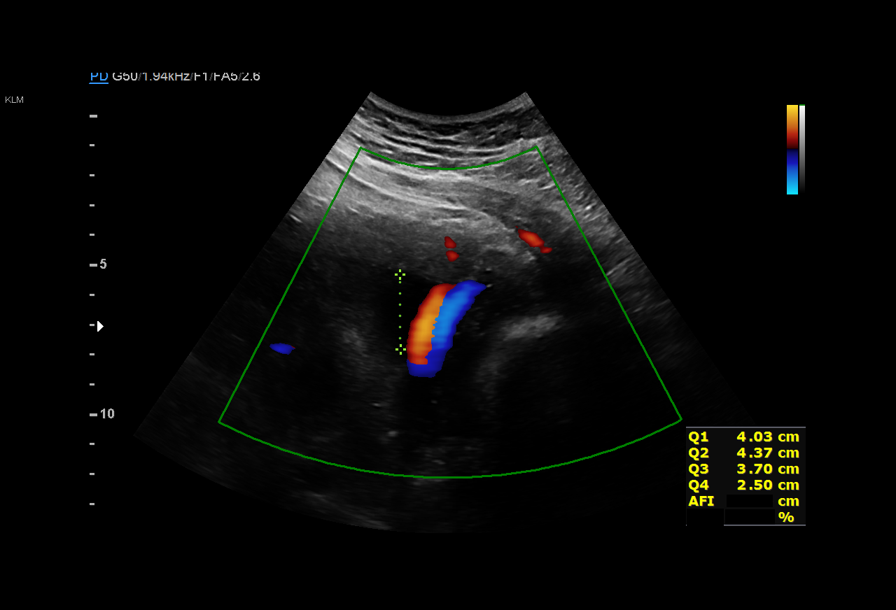
[im 10/20]
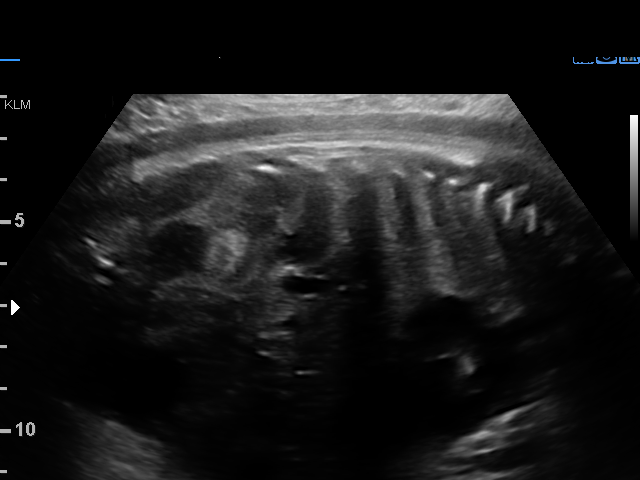
[im 11/20]
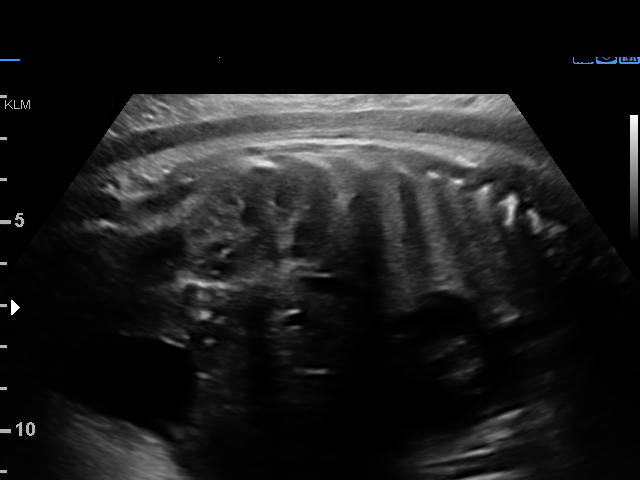
[im 13/20]
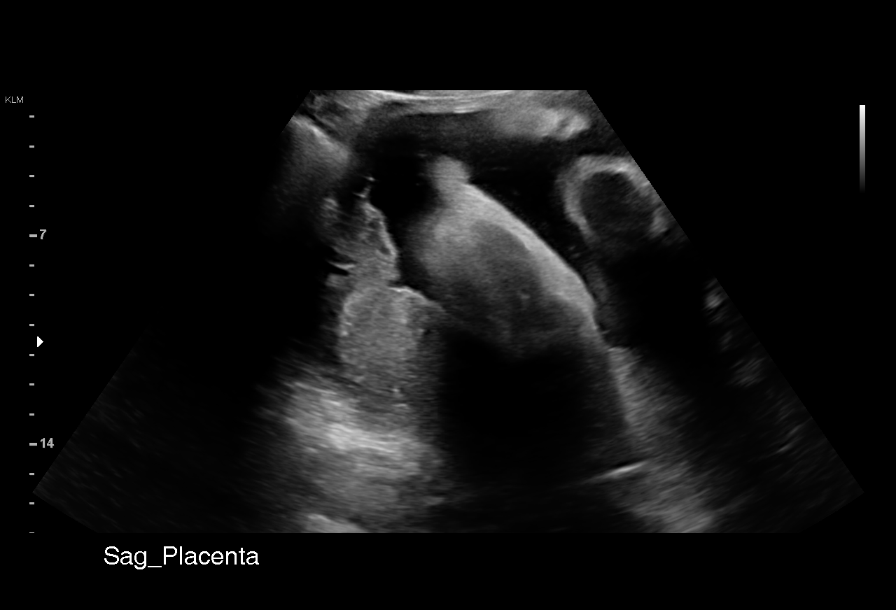
[im 15/20]
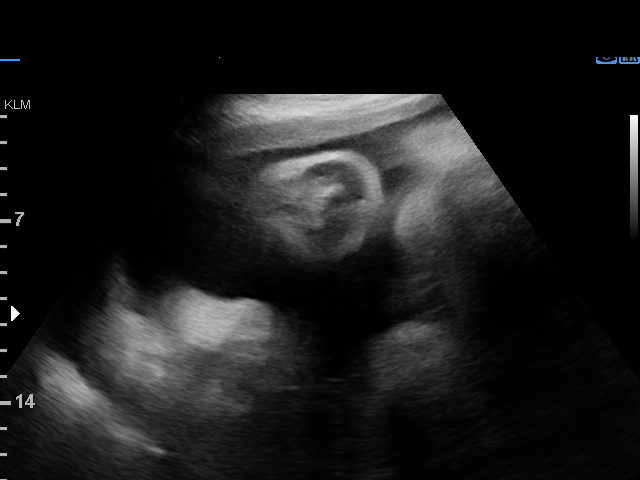
[im 16/20]
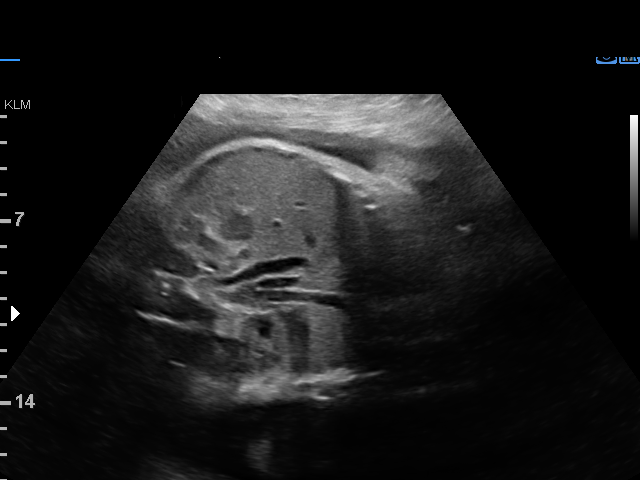
[im 18/20]
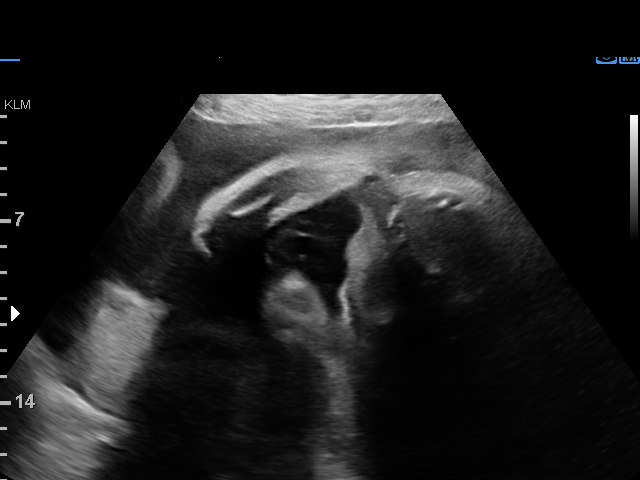
[im 20/20]
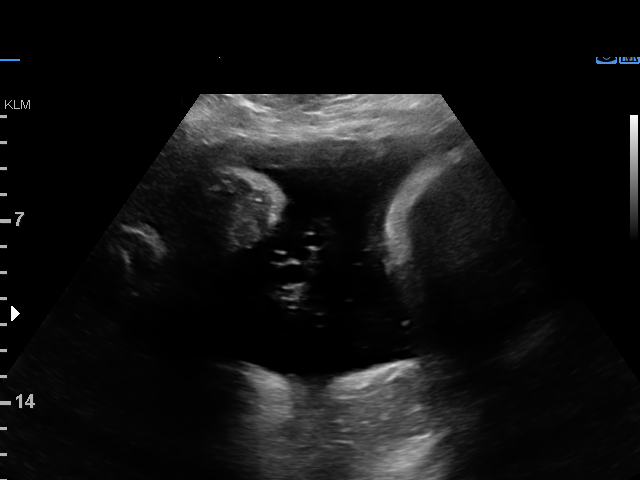

[12 of 20 positions shown; findings below may reference images not displayed]

CNM

 ----------------------------------------------------------------------

 ----------------------------------------------------------------------
Indications

  Non-reactive NST
  34 weeks gestation of pregnancy
  Hypertension - Gestational
 ----------------------------------------------------------------------
Fetal Evaluation

 Num Of Fetuses:         1
 Fetal Heart Rate(bpm):  150
 Cardiac Activity:       Observed
 Presentation:           Cephalic
 Placenta:               Posterior

 Amniotic Fluid
 AFI FV:      Within normal limits

 AFI Sum(cm)     %Tile       Largest Pocket(cm)
 14.6            52

 RUQ(cm)       RLQ(cm)       LUQ(cm)        LLQ(cm)

Biophysical Evaluation

 Amniotic F.V:   Within normal limits       F. Tone:        Observed
 F. Movement:    Observed                   Score:          [DATE]
 F. Breathing:   Observed
OB History

 Gravidity:    1         Term:   0        Prem:   0        SAB:   0
 TOP:          0       Ectopic:  0        Living: 0
Gestational Age

 LMP:           34w 1d        Date:  08/23/18                 EDD:   05/30/19
 Best:          34w 1d     Det. By:  LMP  (08/23/18)          EDD:   05/30/19
Anatomy

 Stomach:               Appears normal, left   Bladder:                Appears normal
                        sided
Comments

 A biophysical profile performed today was [DATE].
 There was normal amniotic fluid noted on today's ultrasound
 exam.

## 2020-07-10 LAB — OB RESULTS CONSOLE HIV ANTIBODY (ROUTINE TESTING): HIV: NONREACTIVE

## 2020-08-27 LAB — OB RESULTS CONSOLE GBS: GBS: NEGATIVE

## 2020-08-28 LAB — OB RESULTS CONSOLE GC/CHLAMYDIA
Chlamydia: NEGATIVE
Gonorrhea: NEGATIVE

## 2020-09-11 ENCOUNTER — Other Ambulatory Visit: Payer: Self-pay | Admitting: Obstetrics and Gynecology

## 2020-09-12 ENCOUNTER — Inpatient Hospital Stay (HOSPITAL_COMMUNITY): Payer: Medicaid Other

## 2020-09-13 ENCOUNTER — Other Ambulatory Visit: Payer: Self-pay

## 2020-09-13 ENCOUNTER — Encounter (HOSPITAL_COMMUNITY): Payer: Self-pay | Admitting: Obstetrics and Gynecology

## 2020-09-13 ENCOUNTER — Inpatient Hospital Stay (HOSPITAL_COMMUNITY): Payer: Medicaid Other | Admitting: Anesthesiology

## 2020-09-13 ENCOUNTER — Inpatient Hospital Stay (HOSPITAL_COMMUNITY)
Admission: AD | Admit: 2020-09-13 | Discharge: 2020-09-15 | DRG: 807 | Disposition: A | Discharge status: Home / Self Care | Payer: Medicaid Other | Attending: Obstetrics and Gynecology | Admitting: Obstetrics and Gynecology

## 2020-09-13 DIAGNOSIS — Z3A38 38 weeks gestation of pregnancy: Secondary | ICD-10-CM | POA: Diagnosis not present

## 2020-09-13 DIAGNOSIS — O10913 Unspecified pre-existing hypertension complicating pregnancy, third trimester: Secondary | ICD-10-CM | POA: Diagnosis present

## 2020-09-13 DIAGNOSIS — Z20822 Contact with and (suspected) exposure to covid-19: Secondary | ICD-10-CM | POA: Diagnosis present

## 2020-09-13 DIAGNOSIS — O1002 Pre-existing essential hypertension complicating childbirth: Secondary | ICD-10-CM | POA: Diagnosis present

## 2020-09-13 LAB — CBC
HCT: 33.8 % — ABNORMAL LOW (ref 36.0–46.0)
Hemoglobin: 10.9 g/dL — ABNORMAL LOW (ref 12.0–15.0)
MCH: 27.5 pg (ref 26.0–34.0)
MCHC: 32.2 g/dL (ref 30.0–36.0)
MCV: 85.1 fL (ref 80.0–100.0)
Platelets: 280 10*3/uL (ref 150–400)
RBC: 3.97 MIL/uL (ref 3.87–5.11)
RDW: 13.2 % (ref 11.5–15.5)
WBC: 9 10*3/uL (ref 4.0–10.5)
nRBC: 0 % (ref 0.0–0.2)

## 2020-09-13 LAB — RESP PANEL BY RT-PCR (FLU A&B, COVID) ARPGX2
Influenza A by PCR: NEGATIVE
Influenza B by PCR: NEGATIVE
SARS Coronavirus 2 by RT PCR: NEGATIVE

## 2020-09-13 LAB — COMPREHENSIVE METABOLIC PANEL
ALT: 18 U/L (ref 0–44)
AST: 26 U/L (ref 15–41)
Albumin: 2.7 g/dL — ABNORMAL LOW (ref 3.5–5.0)
Alkaline Phosphatase: 111 U/L (ref 38–126)
Anion gap: 9 (ref 5–15)
BUN: 5 mg/dL — ABNORMAL LOW (ref 6–20)
CO2: 23 mmol/L (ref 22–32)
Calcium: 8.9 mg/dL (ref 8.9–10.3)
Chloride: 103 mmol/L (ref 98–111)
Creatinine, Ser: 0.69 mg/dL (ref 0.44–1.00)
GFR, Estimated: >60 mL/min (ref >60)
Glucose, Bld: 96 mg/dL (ref 70–99)
Potassium: 3.7 mmol/L (ref 3.5–5.1)
Sodium: 135 mmol/L (ref 135–145)
Total Bilirubin: 0.5 mg/dL (ref 0.3–1.2)
Total Protein: 5.6 g/dL — ABNORMAL LOW (ref 6.5–8.1)

## 2020-09-13 LAB — TYPE AND SCREEN
ABO/RH(D): A NEG
Antibody Screen: POSITIVE

## 2020-09-13 LAB — PROTEIN / CREATININE RATIO, URINE
Creatinine, Urine: 73.16 mg/dL
Total Protein, Urine: <6 mg/dL

## 2020-09-13 LAB — RPR: RPR Ser Ql: NONREACTIVE

## 2020-09-13 MED ORDER — FENTANYL-BUPIVACAINE-NACL 0.5-0.125-0.9 MG/250ML-% EP SOLN
EPIDURAL | Status: AC
  Filled 2020-09-13: qty 250

## 2020-09-13 MED ORDER — ACETAMINOPHEN 325 MG PO TABS: 650.0000 mg | ORAL_TABLET | ORAL | Status: DC | PRN

## 2020-09-13 MED ORDER — WITCH HAZEL-GLYCERIN EX PADS: 1.0000 "application " | MEDICATED_PAD | CUTANEOUS | Status: DC | PRN

## 2020-09-13 MED ORDER — COCONUT OIL OIL: 1.0000 "application " | TOPICAL_OIL | Status: DC | PRN

## 2020-09-13 MED ORDER — SIMETHICONE 80 MG PO CHEW: 80.0000 mg | CHEWABLE_TABLET | ORAL | Status: DC | PRN

## 2020-09-13 MED ORDER — CYCLOBENZAPRINE HCL 5 MG PO TABS: 10.0000 mg | ORAL_TABLET | Freq: Three times a day (TID) | ORAL | Status: DC | PRN

## 2020-09-13 MED ORDER — DIPHENHYDRAMINE HCL 50 MG/ML IJ SOLN
12.5000 mg | INTRAMUSCULAR | Status: DC | PRN
Start: 2020-09-13 — End: 2020-09-13

## 2020-09-13 MED ORDER — LACTATED RINGERS IV SOLN
500.0000 mL | Freq: Once | INTRAVENOUS | Status: AC
  Administered 2020-09-13: 500 mL via INTRAVENOUS

## 2020-09-13 MED ORDER — ONDANSETRON HCL 4 MG PO TABS: 4.0000 mg | ORAL_TABLET | ORAL | Status: DC | PRN

## 2020-09-13 MED ORDER — OXYTOCIN BOLUS FROM INFUSION
333.0000 mL | Freq: Once | INTRAVENOUS | Status: AC
  Administered 2020-09-13: 333 mL via INTRAVENOUS

## 2020-09-13 MED ORDER — OXYCODONE-ACETAMINOPHEN 5-325 MG PO TABS
1.0000 | ORAL_TABLET | ORAL | Status: DC | PRN
Start: 2020-09-13 — End: 2020-09-13

## 2020-09-13 MED ORDER — MEASLES, MUMPS & RUBELLA VAC IJ SOLR: 0.5000 mL | Freq: Once | INTRAMUSCULAR | Status: DC

## 2020-09-13 MED ORDER — METHYLERGONOVINE MALEATE 0.2 MG/ML IJ SOLN
0.2000 mg | INTRAMUSCULAR | Status: DC | PRN
Start: 2020-09-13 — End: 2020-09-15

## 2020-09-13 MED ORDER — DIBUCAINE (PERIANAL) 1 % EX OINT: 1.0000 "application " | TOPICAL_OINTMENT | CUTANEOUS | Status: DC | PRN

## 2020-09-13 MED ORDER — OXYCODONE-ACETAMINOPHEN 5-325 MG PO TABS: 2.0000 | ORAL_TABLET | ORAL | Status: DC | PRN

## 2020-09-13 MED ORDER — NIFEDIPINE ER OSMOTIC RELEASE 30 MG PO TB24
60.0000 mg | ORAL_TABLET | Freq: Every day | ORAL | Status: DC
  Administered 2020-09-13 – 2020-09-15 (×3): 60 mg via ORAL
  Filled 2020-09-13: qty 2
  Filled 2020-09-13: qty 1
  Filled 2020-09-13: qty 2

## 2020-09-13 MED ORDER — BUTORPHANOL TARTRATE 1 MG/ML IJ SOLN
1.0000 mg | INTRAMUSCULAR | Status: DC | PRN
  Administered 2020-09-13: 1 mg via INTRAVENOUS
  Filled 2020-09-13: qty 1

## 2020-09-13 MED ORDER — FENTANYL-BUPIVACAINE-NACL 0.5-0.125-0.9 MG/250ML-% EP SOLN
12.0000 mL/h | EPIDURAL | Status: DC | PRN
  Administered 2020-09-13: 12 mL/h via EPIDURAL

## 2020-09-13 MED ORDER — BENZOCAINE-MENTHOL 20-0.5 % EX AERO
1.0000 "application " | INHALATION_SPRAY | CUTANEOUS | Status: DC | PRN
  Administered 2020-09-14: 1 via TOPICAL
  Filled 2020-09-13 (×2): qty 56

## 2020-09-13 MED ORDER — OXYTOCIN-SODIUM CHLORIDE 30-0.9 UT/500ML-% IV SOLN
1.0000 m[IU]/min | INTRAVENOUS | Status: DC
  Administered 2020-09-13: 2 m[IU]/min via INTRAVENOUS
  Filled 2020-09-13: qty 500

## 2020-09-13 MED ORDER — PHENYLEPHRINE 40 MCG/ML (10ML) SYRINGE FOR IV PUSH (FOR BLOOD PRESSURE SUPPORT): 80.0000 ug | PREFILLED_SYRINGE | INTRAVENOUS | Status: DC | PRN

## 2020-09-13 MED ORDER — EPHEDRINE 5 MG/ML INJ: 10.0000 mg | INTRAVENOUS | Status: DC | PRN

## 2020-09-13 MED ORDER — ONDANSETRON HCL 4 MG/2ML IJ SOLN
4.0000 mg | Freq: Four times a day (QID) | INTRAMUSCULAR | Status: DC | PRN
  Administered 2020-09-13: 4 mg via INTRAVENOUS
  Filled 2020-09-13: qty 2

## 2020-09-13 MED ORDER — MAGNESIUM HYDROXIDE 400 MG/5ML PO SUSP: 30.0000 mL | ORAL | Status: DC | PRN

## 2020-09-13 MED ORDER — METHYLERGONOVINE MALEATE 0.2 MG PO TABS: 0.2000 mg | ORAL_TABLET | ORAL | Status: DC | PRN

## 2020-09-13 MED ORDER — ONDANSETRON HCL 4 MG/2ML IJ SOLN: 4.0000 mg | INTRAMUSCULAR | Status: DC | PRN

## 2020-09-13 MED ORDER — IBUPROFEN 600 MG PO TABS
600.0000 mg | ORAL_TABLET | Freq: Four times a day (QID) | ORAL | Status: DC
  Administered 2020-09-13 – 2020-09-15 (×6): 600 mg via ORAL
  Filled 2020-09-13 (×6): qty 1

## 2020-09-13 MED ORDER — LIDOCAINE HCL (PF) 1 % IJ SOLN: 30.0000 mL | INTRAMUSCULAR | Status: DC | PRN

## 2020-09-13 MED ORDER — TETANUS-DIPHTH-ACELL PERTUSSIS 5-2.5-18.5 LF-MCG/0.5 IM SUSY: 0.5000 mL | PREFILLED_SYRINGE | Freq: Once | INTRAMUSCULAR | Status: DC

## 2020-09-13 MED ORDER — LACTATED RINGERS IV SOLN
500.0000 mL | INTRAVENOUS | Status: DC | PRN
  Administered 2020-09-13: 500 mL via INTRAVENOUS

## 2020-09-13 MED ORDER — TERBUTALINE SULFATE 1 MG/ML IJ SOLN: 0.2500 mg | Freq: Once | INTRAMUSCULAR | Status: DC | PRN

## 2020-09-13 MED ORDER — LIDOCAINE HCL (PF) 1 % IJ SOLN
INTRAMUSCULAR | Status: DC | PRN
  Administered 2020-09-13: 10 mL via EPIDURAL

## 2020-09-13 MED ORDER — MISOPROSTOL 25 MCG QUARTER TABLET
25.0000 ug | ORAL_TABLET | ORAL | Status: DC | PRN
  Administered 2020-09-13 (×2): 25 ug via VAGINAL
  Filled 2020-09-13 (×2): qty 1

## 2020-09-13 MED ORDER — OXYCODONE HCL 5 MG PO TABS: 10.0000 mg | ORAL_TABLET | ORAL | Status: DC | PRN

## 2020-09-13 MED ORDER — LACTATED RINGERS IV SOLN: INTRAVENOUS | Status: DC

## 2020-09-13 MED ORDER — LACTATED RINGERS AMNIOINFUSION: INTRAVENOUS | Status: DC

## 2020-09-13 MED ORDER — PRENATAL MULTIVITAMIN CH
1.0000 | ORAL_TABLET | Freq: Every day | ORAL | Status: DC
  Administered 2020-09-14: 1 via ORAL
  Filled 2020-09-13: qty 1

## 2020-09-13 MED ORDER — ACETAMINOPHEN 325 MG PO TABS
650.0000 mg | ORAL_TABLET | ORAL | Status: DC | PRN
  Administered 2020-09-14: 650 mg via ORAL
  Filled 2020-09-13: qty 2

## 2020-09-13 MED ORDER — OXYTOCIN-SODIUM CHLORIDE 30-0.9 UT/500ML-% IV SOLN: 2.5000 [IU]/h | INTRAVENOUS | Status: DC

## 2020-09-13 MED ORDER — ZOLPIDEM TARTRATE 5 MG PO TABS: 5.0000 mg | ORAL_TABLET | Freq: Every evening | ORAL | Status: DC | PRN

## 2020-09-13 MED ORDER — BUPIVACAINE HCL (PF) 0.25 % IJ SOLN
INTRAMUSCULAR | Status: DC | PRN
  Administered 2020-09-13: 8 mL via EPIDURAL

## 2020-09-13 MED ORDER — SENNOSIDES-DOCUSATE SODIUM 8.6-50 MG PO TABS
2.0000 | ORAL_TABLET | Freq: Every day | ORAL | Status: DC
  Administered 2020-09-14: 2 via ORAL
  Filled 2020-09-13 (×2): qty 2

## 2020-09-13 MED ORDER — SOD CITRATE-CITRIC ACID 500-334 MG/5ML PO SOLN: 30.0000 mL | ORAL | Status: DC | PRN

## 2020-09-13 MED ORDER — DIPHENHYDRAMINE HCL 25 MG PO CAPS: 25.0000 mg | ORAL_CAPSULE | Freq: Four times a day (QID) | ORAL | Status: DC | PRN

## 2020-09-13 MED ORDER — EPHEDRINE 5 MG/ML INJ
10.0000 mg | INTRAVENOUS | Status: DC | PRN
Start: 2020-09-13 — End: 2020-09-13

## 2020-09-13 MED ORDER — OXYCODONE HCL 5 MG PO TABS: 5.0000 mg | ORAL_TABLET | ORAL | Status: DC | PRN

## 2020-09-13 NOTE — Progress Notes (Signed)
Feeling ctx, recently received stadol Afeb, VSS, BP 140/70-80 FHT-120-130, Cat I, irreg ctx VE-4/60/-2, vtx, AROM clear Continue pitocin, monitor progress and BP

## 2020-09-13 NOTE — H&P (Signed)
Holly Jacobs is a 25 y.o. female, G2 P60, EGA 38+ weeks with EDC 8-4 presenting for induction for CHTN.  BP controlled on Procardia XL 60 mg during pregnancy, normal growth at 32 weeks, reactive NSTs, on baby ASA.  OB History     Gravida  2   Para  1   Term  0   Preterm  1   AB  0   Living  1      SAB  0   IAB  0   Ectopic  0   Multiple  0   Live Births  1          Past Medical History:  Diagnosis Date   Depression    Hypertension    Migraine    Past Surgical History:  Procedure Laterality Date   CYSTOSCOPY W/ URETERAL STENT REMOVAL Right 12/30/2012   Procedure: CYSTOSCOPY WITH STENT REMOVAL;  Surgeon: Ky Barban, MD;  Location: [facility];  Service: Urology;  Laterality: Right;   CYSTOSCOPY WITH RETROGRADE PYELOGRAM, URETEROSCOPY AND STENT PLACEMENT Left 12/29/2012   Procedure: CYSTOSCOPY WITH LEFT RETROGRADE PYELOGRAM, BALLOON DILATION LEFT URETER; LEFT URETEROSCOPY ;  Surgeon: Ky Barban, MD;  Location: [facility];  Service: Urology;  Laterality: Left;   CYSTOSCOPY WITH STENT PLACEMENT Right 12/29/2012   Procedure: CYSTOSCOPY WITH STENT PLACEMENT RIGHT URETER;  Surgeon: Ky Barban, MD;  Location: [facility];  Service: Urology;  Laterality: Right;   Family History: family history includes Diabetes in her maternal grandmother; High Cholesterol in her maternal grandmother; Migraines in her father; Skin cancer in her maternal grandmother. Social History:  reports that she has never smoked. She has never used smokeless tobacco. She reports that she does not drink alcohol and does not use drugs.     Maternal Diabetes: No Genetic Screening: Normal Maternal Ultrasounds/Referrals: Normal Fetal Ultrasounds or other Referrals:  None Maternal Substance Abuse:  No Significant Maternal Medications:  Meds include: Other:  Significant Maternal Lab Results:  Group B Strep negative Other Comments:   Procardia  Review of Systems  Respiratory: Negative.     Cardiovascular: Negative.   Maternal Medical History:  Fetal activity: Perceived fetal activity is normal.   Prenatal complications: PIH.   Prenatal Complications - Diabetes: none.  Dilation: 1.5 Effacement (%): 30 Station: -2 Exam by:: Janice Norrie,  RN Blood pressure 125/74, pulse 67, temperature 97.7 F (36.5 C), temperature source Oral, height 5\' 5"  (1.651 m), weight 97.8 kg, last menstrual period 12/20/2019, unknown if currently breastfeeding. Maternal Exam:  Uterine Assessment: Contraction strength is mild.  Contraction frequency is irregular.  Abdomen: Patient reports no abdominal tenderness. Estimated fetal weight is 7 lbs.   Fetal presentation: vertex Introitus: Normal vulva. Normal vagina.  Amniotic fluid character: not assessed. Pelvis: adequate for delivery.     Fetal Exam Fetal Monitor Review: Mode: ultrasound.   Baseline rate: 110-120.  Variability: moderate (6-25 bpm).   Pattern: accelerations present and no decelerations.   Fetal State Assessment: Category I - tracings are normal.  Physical Exam Vitals reviewed.  Constitutional:      Appearance: Normal appearance.  Cardiovascular:     Rate and Rhythm: Normal rate and regular rhythm.  Pulmonary:     Effort: Pulmonary effort is normal. No respiratory distress.  Abdominal:     Palpations: Abdomen is soft.  Genitourinary:    General: Normal vulva.  Neurological:     Mental Status: She is alert.    Prenatal labs: ABO, Rh: --/--/A  NEG (07/23 0331) Antibody: POS (07/23 0331) Rubella:  non-immune RPR:   NR HBsAg:   neg HIV:   NR GBS:   neg  Assessment/Plan: IUP at 38+ weeks with [facility] admitted for ripening and induction.  I had attempted to schedule induction for Thursday night into Friday, but spots were full.  She was scheduled for yesterday morning, but L&D unable to get her in until around 0400 today.  One dose of cytotec placed  At 0445, will continue q 4 hrs, monitor progress and BP  Leighton Roach  Reisa Coppola 09/13/2020, 5:56 AM

## 2020-09-13 NOTE — Lactation Note (Signed)
This note was copied from a baby's chart. Lactation Consultation Note  Patient Name: Holly Jacobs [id number] Date: 09/13/2020 Reason for consult: L&D Initial assessment Age:26 hours  L&D consult with >60 minutes old infant and P2 mother. Newborn is skin to skin with mother. Congratulated them on their newborn. Discussed STS as ideal transition for infants after birth helping with temperature, blood sugar and comfort. Talked about primal reflexes such as rooting, hands to mouth, searching for the breast among others.  Mother reports newborn latched well for ~30 minutes. Newborn is still cueing and mother explains she will latch independently after eating.  Explained LC services availability during postpartum stay. Thanked family for their time.    Feeding  Mother's Current Feeding Choice: Breast Milk  LATCH Score (recorded by RN)  LC did not observe feeding  Latch: Grasps breast easily, tongue down, lips flanged, rhythmical sucking.  Audible Swallowing: Spontaneous and intermittent  Type of Nipple: Everted at rest and after stimulation  Comfort (Breast/Nipple): Soft / non-tender  Hold (Positioning): No assistance needed to correctly position infant at breast.  LATCH Score: 10  Interventions Interventions: Skin to skin   Holly Jacobs [name] 09/13/2020, 8:00 PM

## 2020-09-13 NOTE — Progress Notes (Signed)
Feeling some cramps Afeb, VSS, BP normal FHT-110-120, Cat I VE-2/50/-3 per RN, 2nd cytotec placed Will start pitocin at 1300, monitor progress and BP, anticipate SVD

## 2020-09-13 NOTE — Progress Notes (Signed)
Just got epidural, getting more comfortable Afeb, VSS FHT-120s, mod variability, some variable decels since just prior to epidural, ctx q 2-3 min, Cat II VE-6/90/-1, vtx, IUPC inserted Making progress in labor, BP ok.  Will monitor FHT for persistent variable decels, will do amnioinfusion if persist, continue pitocin for now

## 2020-09-13 NOTE — Anesthesia Procedure Notes (Signed)
Epidural Patient location during procedure: OB Start time: 09/13/2020 3:17 PM End time: 09/13/2020 3:27 PM  Staffing Anesthesiologist: Mellody Dance, MD Performed: anesthesiologist   Preanesthetic Checklist Completed: patient identified, IV checked, site marked, risks and benefits discussed, monitors and equipment checked, pre-op evaluation and timeout performed  Epidural Patient position: sitting Prep: DuraPrep Patient monitoring: heart rate, cardiac monitor, continuous pulse ox and blood pressure Approach: midline Location: L2-L3 Injection technique: LOR saline  Needle:  Needle type: Tuohy  Needle gauge: 17 G Needle length: 9 cm Needle insertion depth: 6 cm Catheter type: closed end flexible Catheter size: 20 Guage Catheter at skin depth: 11 cm Test dose: negative and Other  Assessment Events: blood not aspirated, injection not painful, no injection resistance and negative IV test  Additional Notes Informed consent obtained prior to proceeding including risk of failure, 1% risk of PDPH, risk of minor discomfort and bruising.  Discussed rare but serious complications including epidural abscess, permanent nerve injury, epidural hematoma.  Discussed alternatives to epidural analgesia and patient desires to proceed.  Timeout performed pre-procedure verifying patient name, procedure, and platelet count.  Patient tolerated procedure well.

## 2020-09-13 NOTE — Anesthesia Preprocedure Evaluation (Signed)
Anesthesia Evaluation  Patient identified by MRN, date of birth, ID band Patient awake    Reviewed: Allergy & Precautions, Patient's Chart, lab work & pertinent test results  Airway Mallampati: II  TM Distance: >3 FB Neck ROM: Full    Dental no notable dental hx.    Pulmonary neg pulmonary ROS,    Pulmonary exam normal breath sounds clear to auscultation       Cardiovascular hypertension, Pt. on medications Normal cardiovascular exam Rhythm:Regular Rate:Normal     Neuro/Psych  Headaches, PSYCHIATRIC DISORDERS Depression    GI/Hepatic negative GI ROS, Neg liver ROS,   Endo/Other  negative endocrine ROS  Renal/GU negative Renal ROS  negative genitourinary   Musculoskeletal negative musculoskeletal ROS (+)   Abdominal   Peds  Hematology negative hematology ROS (+)   Anesthesia Other Findings   Reproductive/Obstetrics (+) Pregnancy                             Anesthesia Physical  Anesthesia Plan  ASA: 3  Anesthesia Plan: Epidural   Post-op Pain Management:    Induction:   PONV Risk Score and Plan: Treatment may vary due to age or medical condition  Airway Management Planned: Natural Airway  Additional Equipment:   Intra-op Plan:   Post-operative Plan:   Informed Consent: I have reviewed the patients History and Physical, chart, labs and discussed the procedure including the risks, benefits and alternatives for the proposed anesthesia with the patient or authorized representative who has indicated his/her understanding and acceptance.       Plan Discussed with: Anesthesiologist  Anesthesia Plan Comments: (Patient identified. Risks, benefits, options discussed with patient including but not limited to bleeding, infection, nerve damage, paralysis, failed block, incomplete pain control, headache, blood pressure changes, nausea, vomiting, reactions to medication, itching, and  post partum back pain. Confirmed with bedside nurse the patient's most recent platelet count. Confirmed with the patient that they are not taking any anticoagulation, have any bleeding history or any family history of bleeding disorders. Patient expressed understanding and wishes to proceed. All questions were answered. )        Anesthesia Quick Evaluation

## 2020-09-13 NOTE — Progress Notes (Signed)
Rn called pharmacy to see if it was ok give the patient oxycodone because she is allergic to tramadol. Patient states that oxycodone make her nauseous and tramadol gives her a rash.Pharmacy states that she can take the oxycodone.

## 2020-09-14 MED ORDER — VENLAFAXINE HCL ER 75 MG PO CP24
75.0000 mg | ORAL_CAPSULE | Freq: Every day | ORAL | 6 refills | Status: DC
Start: 2020-09-14 — End: 2023-07-22

## 2020-09-14 MED ORDER — IBUPROFEN 600 MG PO TABS: 600.0000 mg | ORAL_TABLET | Freq: Four times a day (QID) | ORAL | 0 refills | Status: DC

## 2020-09-14 MED ORDER — VENLAFAXINE HCL ER 75 MG PO CP24
75.0000 mg | ORAL_CAPSULE | Freq: Every day | ORAL | Status: DC
  Administered 2020-09-14 – 2020-09-15 (×2): 75 mg via ORAL
  Filled 2020-09-14 (×2): qty 1

## 2020-09-14 MED ORDER — RHO D IMMUNE GLOBULIN 1500 UNIT/2ML IJ SOSY
300.0000 ug | PREFILLED_SYRINGE | Freq: Once | INTRAMUSCULAR | Status: AC
  Administered 2020-09-14: 300 ug via INTRAVENOUS
  Filled 2020-09-14: qty 2

## 2020-09-14 MED ORDER — NIFEDIPINE ER 60 MG PO TB24: 60.0000 mg | ORAL_TABLET | Freq: Every day | ORAL | 6 refills | Status: DC

## 2020-09-14 NOTE — Progress Notes (Signed)
PPD #1 No problems, wants to go home this pm if possible Afeb, VSS Fundus firm, NT at U-1 Continue routine postpartum care, d/c home this pm if BP stable and baby ok to go

## 2020-09-14 NOTE — Progress Notes (Signed)
CSW met with MOB to complete consult for history of depression, and anxiety. CSW observed MOB resting in bed, infant in bassinet, and FOB laying on couch. MOB gave CSW verbal consent to complete consult while FOB was present. CSW explained role, and reason for consult. MOB was pleasant, polite, and engaged with CSW. MOB reported, history of depression (25 yrs old), and anxiety, after the birth of her first daughter (2 yrs ago). MOB reported, history of Effexor XR, and that she receive dosage this morning while inpatient. MOB reported, she plan to continue medication once discharge.    CSW provided education regarding the baby blues period vs. perinatal mood disorders, discussed treatment and gave resources for mental health follow up if concerns arise. CSW recommends self- evaluation during the postpartum time period using the New Mom Checklist from Postpartum Progress and encouraged MOB to contact a medical professional if symptoms are noted at any time.   When CSW asked MOB of her emotions since delivery. MOB reported, she feels, "good". MOB reported, FOB, and family are her supports. MOB denied SI, and HI when CSW assessed for safety.   MOB reported, there are no barriers to follow up infant's care. MOB reported, she has all essentials needed to care for infant. MOB reported, infant has a car seat, bassinet, and crib. MOB denied any additional barriers.     CSW provided education on sudden infant death syndrome (SIDS).  CSW identifies no further need for intervention or barriers to discharge at this time.   Darcus Austin, MSW, LCSW-A Clinical Social Worker- Weekends [phone]

## 2020-09-14 NOTE — Discharge Instructions (Signed)
As per discharge pamphlet °

## 2020-09-14 NOTE — Lactation Note (Signed)
This note was copied from a baby's chart. Lactation Consultation Note  Patient Name: [name] [id number] Date: 09/14/2020 Reason for consult: Initial assessment Age:26 hours  P2, Mother reports that she breast fed her first child for 16 months.  Mother reports that this child has been breastfeeding well.  She denies having any discomfort.  Basic teaching reviewed , hand expression , discussed cue base feeding and cluster feeding. Lactation Brochure given . Mother up getting ready to take a shower. LC ask her to page to check latch when infant is ready to feed again.   Maternal Data Has patient been taught Hand Expression?: Yes Does the patient have breastfeeding experience prior to this delivery?: Yes How long did the patient breastfeed?: 10 months  Feeding Mother's Current Feeding Choice: Breast Milk  LATCH Score                    Lactation Tools Discussed/Used    Interventions    Discharge    Consult Status Consult Status: Follow-up Date: 09/15/20 Follow-up type: In-patient    Stevan Born [facility] 09/14/2020, 1:02 PM

## 2020-09-14 NOTE — Anesthesia Postprocedure Evaluation (Signed)
Anesthesia Post Note  Patient: Holly Jacobs  Procedure(s) Performed: AN AD HOC LABOR EPIDURAL     Patient location during evaluation: Mother Baby Anesthesia Type: Epidural Level of consciousness: awake Pain management: satisfactory to patient Vital Signs Assessment: post-procedure vital signs reviewed and stable Respiratory status: spontaneous breathing Cardiovascular status: stable Anesthetic complications: no   No notable events documented.  Last Vitals:  Vitals:   09/14/20 0619 09/14/20 0935  BP: 135/79 112/65  Pulse: 66 70  Resp: 18 16  Temp: 36.8 C 36.6 C  SpO2: 100% 99%    Last Pain:  Vitals:   09/14/20 0935  TempSrc: Oral  PainSc: 0-No pain   Pain Goal:                   [employer]

## 2020-09-15 ENCOUNTER — Encounter (HOSPITAL_COMMUNITY): Payer: Self-pay | Admitting: *Deleted

## 2020-09-15 LAB — RH IG WORKUP (INCLUDES ABO/RH)
Fetal Screen: NEGATIVE
Gestational Age(Wks): 38.3
Unit division: 0

## 2020-09-15 NOTE — Discharge Summary (Signed)
Postpartum Discharge Summary  Date of Service updated     Patient Name: Holly Jacobs DOB: 12-09-1995 MRN: [id number]  Date of admission: 09/13/2020 Delivery date:09/13/2020  Delivering provider: Jackelyn Knife, TODD  Date of discharge: 09/15/2020  Admitting diagnosis: Pre-existing hypertension affecting pregnancy in third trimester [O10.913] Intrauterine pregnancy: [redacted]w[redacted]d     Secondary diagnosis:  Active Problems:   Pre-existing hypertension affecting pregnancy in third trimester  Additional problems: Vacuum assisted vaginal delivery    Discharge diagnosis: Term Pregnancy Delivered and CHTN                                              Post partum procedures: NONR Augmentation: AROM, Pitocin, and Cytotec Complications: None  Hospital course: Induction of Labor With Vaginal Delivery   25 y.o. yo G2P0101 at [redacted]w[redacted]d was admitted to the hospital 09/13/2020 for induction of labor.  Indication for induction:  CHTN on meds .  Patient had an uncomplicated labor course as follows: Membrane Rupture Time/Date: 2:12 PM ,09/13/2020   Delivery Method:Vaginal, Spontaneous  Episiotomy: None  Lacerations:  1st degree;Perineal  Details of delivery can be found in separate delivery note.  Patient had a routine postpartum course. Patient is discharged home 09/15/20.  Newborn Data: Birth date:09/13/2020  Birth time:7:06 PM  Gender:Female  Living status:Living  Apgars:8 ,9  Weight:3141 g   Physical exam  Vitals:   09/14/20 0935 09/14/20 1300 09/14/20 2010 09/15/20 0519  BP: 112/65 134/81 132/83 125/76  Pulse: 70 68 72 71  Resp: 16 18 18 18   Temp: 97.8 F (36.6 C) 97.8 F (36.6 C) 98.2 F (36.8 C) 97.7 F (36.5 C)  TempSrc: Oral Oral Oral Oral  SpO2: 99% 99% 100% 100%  Weight:      Height:       General: alert, cooperative, and no distress Lochia: appropriate Uterine Fundus: firm Incision: N/A DVT Evaluation: No evidence of DVT seen on physical exam. Negative Homan's sign. No cords  or calf tenderness. Labs: Lab Results  Component Value Date   WBC 9.0 09/13/2020   HGB 10.9 (L) 09/13/2020   HCT 33.8 (L) 09/13/2020   MCV 85.1 09/13/2020   PLT 280 09/13/2020   CMP Latest Ref Rng & Units 09/13/2020  Glucose 70 - 99 mg/dL 96  BUN 6 - 20 mg/dL 5(L)  Creatinine 09/15/2020 - 1.00 mg/dL 1.93  Sodium 7.90 - 240 mmol/L 135  Potassium 3.5 - 5.1 mmol/L 3.7  Chloride 98 - 111 mmol/L 103  CO2 22 - 32 mmol/L 23  Calcium 8.9 - 10.3 mg/dL 8.9  Total Protein 6.5 - 8.1 g/dL 973)  Total Bilirubin 0.3 - 1.2 mg/dL 0.5  Alkaline Phos 38 - 126 U/L 111  AST 15 - 41 U/L 26  ALT 0 - 44 U/L 18   Edinburgh Score: Edinburgh Postnatal Depression Scale Screening Tool 09/14/2020  I have been able to laugh and see the funny side of things. 0  I have looked forward with enjoyment to things. 0  I have blamed myself unnecessarily when things went wrong. 0  I have been anxious or worried for no good reason. 3  I have felt scared or panicky for no good reason. 2  Things have been getting on top of me. 0  I have been so unhappy that I have had difficulty sleeping. 0  I have felt sad or  miserable. 0  I have been so unhappy that I have been crying. 0  The thought of harming myself has occurred to me. 0  Edinburgh Postnatal Depression Scale Total 5      After visit meds:  Allergies as of 09/15/2020       Reactions   Imitrex [sumatriptan Base] Anaphylaxis, Shortness Of Breath, Itching   Sulfa Antibiotics Anaphylaxis   Latex Hives   Tramadol Hives        Medication List     TAKE these medications    acetaminophen 500 MG tablet Commonly known as: TYLENOL Take 1,000 mg by mouth every 6 (six) hours as needed for mild pain or headache.   calcium carbonate 500 MG chewable tablet Commonly known as: TUMS - dosed in mg elemental calcium Chew 2 tablets by mouth daily as needed for indigestion or heartburn.   cyclobenzaprine 10 MG tablet Commonly known as: FLEXERIL Take 10 mg by mouth 3  (three) times daily as needed for muscle spasms.   ferrous sulfate 325 (65 FE) MG tablet Take 325 mg by mouth every other day.   ibuprofen 600 MG tablet Commonly known as: ADVIL Take 1 tablet (600 mg total) by mouth every 6 (six) hours. What changed: Another medication with the same name was added. Make sure you understand how and when to take each.   ibuprofen 600 MG tablet Commonly known as: ADVIL Take 1 tablet (600 mg total) by mouth every 6 (six) hours. What changed: You were already taking a medication with the same name, and this prescription was added. Make sure you understand how and when to take each.   NIFEdipine 60 MG 24 hr tablet Commonly known as: ADALAT CC Take 1 tablet (60 mg total) by mouth daily. What changed:  medication strength how much to take   prenatal multivitamin Tabs tablet Take 1 tablet by mouth daily at 12 noon.   promethazine 25 MG tablet Commonly known as: PHENERGAN Take 1 tablet (25 mg total) by mouth every 6 (six) hours as needed for nausea or vomiting.   venlafaxine XR 75 MG 24 hr capsule Commonly known as: EFFEXOR-XR Take 1 capsule (75 mg total) by mouth daily with breakfast.         Discharge home in stable condition Infant Feeding: Breast Infant Disposition:home with mother Discharge instruction: per After Visit Summary and Postpartum booklet. Activity: Advance as tolerated. Pelvic rest for 6 weeks.  Diet: routine diet Anticipated Birth Control: Nexplanon Postpartum Appointment:6 weeks Additional Postpartum F/U: BP check 1 week Future Appointments:No future appointments. Follow up Visit:  Follow-up Information     Meisinger, Tawanna Cooler, MD. Schedule an appointment as soon as possible for a visit.   Specialty: Obstetrics and Gynecology Why: for BP check Contact information: [address] [place] [address] [phone]                     09/15/2020 Carlisle Cater, MD

## 2020-09-15 NOTE — Lactation Note (Signed)
This note was copied from a baby's chart. Lactation Consultation Note  Patient Name: Girl Julyssa Kyer [id number] Date: 09/15/2020 Reason for consult: Follow-up assessment;Early term 37-38.6wks;Infant weight loss;Other (Comment) (5 % weight loss / Bili WNL, mom denies sore nipples and expressed breast feeding is going well. Milk coming in and hearing more swallows.) Age:26 hours Baby last fed at 0922 for 25 mins.  LC reviewed D/C teaching - see below.  Mom has the [facility] brochure with resources.   Maternal Data    Feeding Mother's Current Feeding Choice: Breast Milk  LATCH Score                    Lactation Tools Discussed/Used    Interventions Interventions: Breast feeding basics reviewed;Education  Discharge Discharge Education: Engorgement and breast care;Warning signs for feeding baby Pump: Personal;Manual;DEBP  Consult Status Consult Status: Complete Date: 09/15/20    Kathrin Greathouse 09/15/2020, 11:32 AM

## 2020-09-15 NOTE — Progress Notes (Signed)
POSTPARTUM PROGRESS NOTE  Post Partum Day #2  Subjective:  No acute events overnight.  Pt denies problems with ambulating, voiding or po intake.  Just threw up some banana pudding as she feels it didn't taste right but otherwise is tolerating PO without issue.  Pain is well controlled.  Lochia Minimal.   Objective: Blood pressure 125/76, pulse 71, temperature 97.7 F (36.5 C), temperature source Oral, resp. rate 18, height 5\' 5"  (1.651 m), weight 97.8 kg, last menstrual period 12/20/2019, SpO2 100 %, unknown if currently breastfeeding.  Physical Exam:  General: alert, cooperative and no distress Lochia:normal flow Chest: CTAB Heart: RRR no m/r/g Abdomen: +BS, soft, nontender Uterine Fundus: firm, 2cm below umbilicus GU: suture intact, healing well, no purulent drainage Extremities: neg edema, neg calf TTP BL, neg Homans BL  Recent Labs    09/13/20 0331  HGB 10.9*  HCT 33.8*    Assessment/Plan:  ASSESSMENT: Holly Jacobs is a 25 y.o. G2P0101 s/p VAVD (FHR decels) @ [redacted]w[redacted]d. PNC c/b CHTN on meds, h/o PTD  Discharge home and Breastfeeding 1wk BP check, desires nexplanon for pp contraception   LOS: 2 days

## 2020-09-24 ENCOUNTER — Telehealth (HOSPITAL_COMMUNITY): Payer: Self-pay | Admitting: *Deleted

## 2020-09-24 NOTE — Telephone Encounter (Signed)
Patient voiced no questions or concerns regarding her own health. EPDS = 5. Patient voiced no questions or concerns regarding baby at this time. Reported infant sleeps on her back in a bassinet. RN reviewed ABCs of safe sleep - patient verbalized understanding. Patient requested RN email information regarding hospital's postpartum classes and support groups. Email sent. Deforest Hoyles, RN, 09/24/20, 1946.

## 2021-09-04 ENCOUNTER — Emergency Department (HOSPITAL_COMMUNITY)
Admission: EM | Admit: 2021-09-04 | Discharge: 2021-09-04 | Disposition: A | Discharge status: Home / Self Care | Payer: Medicaid Other | Attending: Emergency Medicine | Admitting: Emergency Medicine

## 2021-09-04 ENCOUNTER — Emergency Department (HOSPITAL_COMMUNITY): Payer: Medicaid Other

## 2021-09-04 ENCOUNTER — Encounter (HOSPITAL_COMMUNITY): Payer: Self-pay | Admitting: Emergency Medicine

## 2021-09-04 DIAGNOSIS — I10 Essential (primary) hypertension: Secondary | ICD-10-CM | POA: Insufficient documentation

## 2021-09-04 DIAGNOSIS — N202 Calculus of kidney with calculus of ureter: Secondary | ICD-10-CM | POA: Diagnosis not present

## 2021-09-04 DIAGNOSIS — N200 Calculus of kidney: Secondary | ICD-10-CM

## 2021-09-04 DIAGNOSIS — R112 Nausea with vomiting, unspecified: Secondary | ICD-10-CM | POA: Diagnosis present

## 2021-09-04 LAB — PREGNANCY, URINE: Preg Test, Ur: NEGATIVE

## 2021-09-04 LAB — COMPREHENSIVE METABOLIC PANEL
ALT: 21 U/L (ref 0–44)
AST: 21 U/L (ref 15–41)
Albumin: 4.7 g/dL (ref 3.5–5.0)
Alkaline Phosphatase: 75 U/L (ref 38–126)
Anion gap: 7 (ref 5–15)
BUN: 13 mg/dL (ref 6–20)
CO2: 25 mmol/L (ref 22–32)
Calcium: 9.6 mg/dL (ref 8.9–10.3)
Chloride: 106 mmol/L (ref 98–111)
Creatinine, Ser: 0.69 mg/dL (ref 0.44–1.00)
GFR, Estimated: >60 mL/min (ref >60)
Glucose, Bld: 104 mg/dL — ABNORMAL HIGH (ref 70–99)
Potassium: 3.7 mmol/L (ref 3.5–5.1)
Sodium: 138 mmol/L (ref 135–145)
Total Bilirubin: 0.6 mg/dL (ref 0.3–1.2)
Total Protein: 7.7 g/dL (ref 6.5–8.1)

## 2021-09-04 LAB — URINALYSIS, ROUTINE W REFLEX MICROSCOPIC
Bacteria, UA: NONE SEEN
Bilirubin Urine: NEGATIVE
Glucose, UA: NEGATIVE mg/dL
Ketones, ur: NEGATIVE mg/dL
Leukocytes,Ua: NEGATIVE
Nitrite: NEGATIVE
Protein, ur: NEGATIVE mg/dL
Specific Gravity, Urine: 1.008 (ref 1.005–1.030)
pH: 7 (ref 5.0–8.0)

## 2021-09-04 LAB — CBC
HCT: 44.4 % (ref 36.0–46.0)
Hemoglobin: 14.9 g/dL (ref 12.0–15.0)
MCH: 30.5 pg (ref 26.0–34.0)
MCHC: 33.6 g/dL (ref 30.0–36.0)
MCV: 91 fL (ref 80.0–100.0)
Platelets: 268 10*3/uL (ref 150–400)
RBC: 4.88 MIL/uL (ref 3.87–5.11)
RDW: 11.9 % (ref 11.5–15.5)
WBC: 7 10*3/uL (ref 4.0–10.5)
nRBC: 0 % (ref 0.0–0.2)

## 2021-09-04 MED ORDER — DIPHENHYDRAMINE HCL 25 MG PO CAPS
50.0000 mg | ORAL_CAPSULE | Freq: Once | ORAL | Status: AC
  Administered 2021-09-04: 50 mg via ORAL
  Filled 2021-09-04: qty 2

## 2021-09-04 MED ORDER — OXYCODONE HCL 5 MG PO TABS: 5.0000 mg | ORAL_TABLET | ORAL | 0 refills | Status: DC | PRN

## 2021-09-04 MED ORDER — TAMSULOSIN HCL 0.4 MG PO CAPS: 0.4000 mg | ORAL_CAPSULE | Freq: Every day | ORAL | 0 refills | Status: DC

## 2021-09-04 MED ORDER — TAMSULOSIN HCL 0.4 MG PO CAPS
0.4000 mg | ORAL_CAPSULE | Freq: Once | ORAL | Status: AC
  Administered 2021-09-04: 0.4 mg via ORAL
  Filled 2021-09-04: qty 1

## 2021-09-04 MED ORDER — SODIUM CHLORIDE 0.9 % IV BOLUS
1000.0000 mL | Freq: Once | INTRAVENOUS | Status: AC
  Administered 2021-09-04: 1000 mL via INTRAVENOUS

## 2021-09-04 MED ORDER — HYDROMORPHONE HCL 1 MG/ML IJ SOLN
0.5000 mg | Freq: Once | INTRAMUSCULAR | Status: AC
  Administered 2021-09-04: 0.5 mg via INTRAVENOUS
  Filled 2021-09-04: qty 0.5

## 2021-09-04 MED ORDER — ONDANSETRON HCL 4 MG/2ML IJ SOLN
4.0000 mg | Freq: Once | INTRAMUSCULAR | Status: AC
  Administered 2021-09-04: 4 mg via INTRAVENOUS
  Filled 2021-09-04: qty 2

## 2021-09-04 MED ORDER — KETOROLAC TROMETHAMINE 15 MG/ML IJ SOLN
15.0000 mg | Freq: Once | INTRAMUSCULAR | Status: AC
  Administered 2021-09-04: 15 mg via INTRAVENOUS
  Filled 2021-09-04: qty 1

## 2021-09-04 NOTE — Discharge Instructions (Signed)
You were evaluated in the Emergency Department and after careful evaluation, we did not find any emergent condition requiring admission or further testing in the hospital.  Your exam/testing today is overall reassuring.  Symptoms seem to be due to a kidney stone.  Use the Flomax daily to help you pass the stone.  Recommend Tylenol 1000 mg every 4-6 hours and/or Motrin 600 mg every 4-6 hours for pain.  You can use the oxycodone medication for more significant pain.  Recommend follow-up with the urologists for continued care.  Please return to the Emergency Department if you experience any worsening of your condition.   Thank you for allowing [place] to be a part of your care.

## 2021-09-04 NOTE — ED Provider Notes (Signed)
[facility] Emergency Department Provider Note MRN:  [id number]  Arrival date & time: 09/04/21     Chief Complaint   Flank Pain   History of Present Illness   Holly Jacobs is a 26 y.o. year-old female with a history of hypertension, kidney stone presenting to the ED with chief complaint of flank pain.  Acute onset of flank pain on the right side about an hour or 2 ago associated with nausea and vomiting.  Pain wraps around to the right groin.  No dysuria, mild hematuria the patient is finishing her menstrual cycle at this time.  No fever.  Had a stone years ago that required a stent.  Review of Systems  A thorough review of systems was obtained and all systems are negative except as noted in the HPI and PMH.   Patient's Health History    Past Medical History:  Diagnosis Date   Depression    Hypertension    Migraine     Past Surgical History:  Procedure Laterality Date   CYSTOSCOPY W/ URETERAL STENT REMOVAL Right 12/30/2012   Procedure: CYSTOSCOPY WITH STENT REMOVAL;  Surgeon: Ky Barban, MD;  Location: [facility];  Service: Urology;  Laterality: Right;   CYSTOSCOPY WITH RETROGRADE PYELOGRAM, URETEROSCOPY AND STENT PLACEMENT Left 12/29/2012   Procedure: CYSTOSCOPY WITH LEFT RETROGRADE PYELOGRAM, BALLOON DILATION LEFT URETER; LEFT URETEROSCOPY ;  Surgeon: Ky Barban, MD;  Location: [facility];  Service: Urology;  Laterality: Left;   CYSTOSCOPY WITH STENT PLACEMENT Right 12/29/2012   Procedure: CYSTOSCOPY WITH STENT PLACEMENT RIGHT URETER;  Surgeon: Ky Barban, MD;  Location: [facility];  Service: Urology;  Laterality: Right;    Family History  Problem Relation Age of Onset   Migraines Father    Skin cancer Maternal Grandmother    Diabetes Maternal Grandmother    High Cholesterol Maternal Grandmother     Social History   Socioeconomic History   Marital status: Legally Separated    Spouse name: Not on file   Number of children: 0    Years of education: HS   Highest education level: Not on file  Occupational History    Employer: [employer]     Comment: [employer]    Employer: OTHER    Comment: Student  Tobacco Use   Smoking status: Never   Smokeless tobacco: Never  Vaping Use   Vaping Use: Never used  Substance and Sexual Activity   Alcohol use: No   Drug use: No   Sexual activity: Yes  Other Topics Concern   Not on file  Social History Narrative   Patient lives at home with mom, grandma, step-grandpa.   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    Caffeine Use: 1 soda daily occasionally   Social Determinants of Health   Financial Resource Strain: Not on file  Food Insecurity: Not on file  Transportation Needs: Not on file  Physical Activity: Not on file  Stress: Not on file  Social Connections: Not on file  Intimate Partner Violence: Not on file     Physical Exam   Vitals:   09/04/21 0404  BP: (!) 149/94  Pulse: 80  Resp: 18  Temp: 98.2 F (36.8 C)  SpO2: 99%    CONSTITUTIONAL: Well-appearing, NAD NEURO/PSYCH:  Alert and oriented x 3, no focal deficits EYES:  eyes equal and reactive ENT/NECK:  no LAD, no JVD CARDIO: Regular rate, well-perfused, normal S1 and S2 PULM:  CTAB no wheezing or rhonchi GI/GU:  non-distended, non-tender MSK/SPINE:  No gross deformities, no edema SKIN:  no rash, atraumatic   *Additional and/or pertinent findings included in MDM below  Diagnostic and Interventional Summary    EKG Interpretation  Date/Time:    Ventricular Rate:    PR Interval:    QRS Duration:   QT Interval:    QTC Calculation:   R Axis:     Text Interpretation:         Labs Reviewed  COMPREHENSIVE METABOLIC PANEL - Abnormal; Notable for the following components:      Result Value   Glucose, Bld 104 (*)    All other components within normal limits  URINALYSIS, ROUTINE W REFLEX MICROSCOPIC - Abnormal; Notable for the following components:   Hgb urine  dipstick LARGE (*)    All other components within normal limits  CBC  PREGNANCY, URINE    CT RENAL STONE STUDY  Final Result      Medications  tamsulosin (FLOMAX) capsule 0.4 mg (has no administration in time range)  sodium chloride 0.9 % bolus 1,000 mL (1,000 mLs Intravenous New Bag/Given 09/04/21 0451)  ondansetron (ZOFRAN) injection 4 mg (4 mg Intravenous Given 09/04/21 0451)  ketorolac (TORADOL) 15 MG/ML injection 15 mg (15 mg Intravenous Given 09/04/21 0451)  HYDROmorphone (DILAUDID) injection 0.5 mg (0.5 mg Intravenous Given 09/04/21 0452)     Procedures  /  Critical Care Procedures  ED Course and Medical Decision Making  Initial Impression and Ddx History and exam is suspicious for kidney stone, will obtain labs, urine, CT scan given her history of procedural intervention with stones.  Past medical/surgical history that increases complexity of ED encounter: Kidney stones  Interpretation of Diagnostics I personally reviewed the CT renal study and my interpretation is as follows: Proximal ureteral stone on the right, mild hydro-  Labs reassuring with no significant blood count or electrolyte disturbance, no AKI, no signs of UTI  Patient Reassessment and Ultimate Disposition/Management     Patient's pain is much better controlled, normal vital signs, sitting comfortably.  Has an anaphylactic allergy to sulfa drugs.  Theory of cross-reactivity with Flomax, unlikely per pharmacy.  Will give first dose and observe for 30 minutes.  Anticipating discharge.  Patient management required discussion with the following services or consulting groups:  None  Complexity of Problems Addressed Acute illness or injury that poses threat of life of bodily function  Additional Data Reviewed and Analyzed Further history obtained from: Further history from spouse/family member  Additional Factors Impacting ED Encounter Risk Use of parenteral controlled substances  Elmer Sow. Pilar Plate, MD [facility]  Health Emergency Medicine [facility] [email or web address]  Final Clinical Impressions(s) / ED Diagnoses     ICD-10-CM   1. Kidney stone  N20.0       ED Discharge Orders          Ordered    tamsulosin (FLOMAX) 0.4 MG CAPS capsule  Daily        09/04/21 0612    oxyCODONE (ROXICODONE) 5 MG immediate release tablet  Every 4 hours PRN        09/04/21 0612             Discharge Instructions Discussed with and Provided to Patient:     Discharge Instructions      You were evaluated in the Emergency Department and after careful evaluation, we did not find any emergent condition requiring admission or further testing in the hospital.  Your  exam/testing today is overall reassuring.  Symptoms seem to be due to a kidney stone.  Use the Flomax daily to help you pass the stone.  Recommend Tylenol 1000 mg every 4-6 hours and/or Motrin 600 mg every 4-6 hours for pain.  You can use the oxycodone medication for more significant pain.  Recommend follow-up with the urologists for continued care.  Please return to the Emergency Department if you experience any worsening of your condition.   Thank you for allowing [place] to be a part of your care.       Sabas Sous, MD 09/04/21 [phone]

## 2021-09-04 NOTE — ED Triage Notes (Signed)
Pt c/o sudden right flank pain that woke her up this morning. Pt states she tried to take a hot shower with no relief. States she has also had some vomiting due to the pain. Hx of kidney stones.

## 2021-09-04 NOTE — ED Notes (Signed)
Patient transported to CT 

## 2021-09-04 NOTE — ED Notes (Signed)
ED Provider at bedside. Ok for d/c

## 2021-09-11 ENCOUNTER — Other Ambulatory Visit: Payer: Self-pay | Admitting: Urology

## 2021-09-18 NOTE — Progress Notes (Signed)
Anesthesia Review:  PCP: Cardiologist : Chest x-ray : EKG : Echo : Stress test: Cardiac Cath :  Activity level:  Sleep Study/ CPAP : Fasting Blood Sugar :      / Checks Blood Sugar -- times a day:   Blood Thinner/ Instructions /Last Dose: ASA / Instructions/ Last Dose :  

## 2021-09-21 NOTE — Patient Instructions (Signed)
SURGICAL WAITING ROOM VISITATION Patients having surgery or a procedure may have no more than 2 support people in the waiting area - these visitors may rotate.   Children under the age of 44 must have an adult with them who is not the patient. If the patient needs to stay at the hospital during part of their recovery, the visitor guidelines for inpatient rooms apply. Pre-op nurse will coordinate an appropriate time for 1 support person to accompany patient in pre-op.  This support person may not rotate.    Please refer to the [facility] website for the visitor guidelines for Inpatients (after your surgery is over and you are in a regular room).       Your procedure is scheduled on:   09/28/2021    Report to [facility] Main Entrance    Report to admitting at   [phone]   Call this number if you have problems the morning of surgery [phone]   Do not eat food :After Midnight.   After Midnight you may have the following liquids until ___ 0430am ___ AM/ PM DAY OF SURGERY  Water Non-Citrus Juices (without pulp, NO RED) Carbonated Beverages Black Coffee (NO MILK/CREAM OR CREAMERS, sugar ok)  Clear Tea (NO MILK/CREAM OR CREAMERS, sugar ok) regular and decaf                             Plain Jell-O (NO RED)                                           Fruit ices (not with fruit pulp, NO RED)                                     Popsicles (NO RED)                                                               Sports drinks like Gatorade (NO RED)                          Oral Hygiene is also important to reduce your risk of infection.                                    Remember - BRUSH YOUR TEETH THE MORNING OF SURGERY WITH YOUR REGULAR TOOTHPASTE   Do NOT smoke after Midnight   Take these medicines the morning of surgery with A SIP OF WATER:  none   DO NOT TAKE ANY ORAL DIABETIC MEDICATIONS DAY OF YOUR SURGERY  Bring CPAP mask and tubing day of surgery.                               You may not have any metal on your body including hair pins, jewelry, and body piercing             Do not  wear make-up, lotions, powders, perfumes/cologne, or deodorant  Do not wear nail polish including gel and S&S, artificial/acrylic nails, or any other type of covering on natural nails including finger and toenails. If you have artificial nails, gel coating, etc. that needs to be removed by a nail salon please have this removed prior to surgery or surgery may need to be canceled/ delayed if the surgeon/ anesthesia feels like they are unable to be safely monitored.   Do not shave  48 hours prior to surgery.               Men may shave face and neck.   Do not bring valuables to the hospital. [place].   Contacts, dentures or bridgework may not be worn into surgery.   Bring small overnight bag day of surgery.   DO NOT [place]. PHARMACY WILL DISPENSE MEDICATIONS LISTED ON YOUR MEDICATION LIST TO YOU DURING YOUR ADMISSION [place]!    Patients discharged on the day of surgery will not be allowed to drive home.  Someone NEEDS to stay with you for the first 24 hours after anesthesia.   Special Instructions: Bring a copy of your healthcare power of attorney and living will documents         the day of surgery if you haven't scanned them before.              Please read over the following fact sheets you were given: IF YOU HAVE QUESTIONS ABOUT YOUR PRE-OP INSTRUCTIONS PLEASE CALL [phone]     [facility] - Preparing for Surgery Before surgery, you can play an important role.  Because skin is not sterile, your skin needs to be as free of germs as possible.  You can reduce the number of germs on your skin by washing with CHG (chlorahexidine gluconate) soap before surgery.  CHG is an antiseptic cleaner which kills germs and bonds with the skin to continue killing germs even after  washing. Please DO NOT use if you have an allergy to CHG or antibacterial soaps.  If your skin becomes reddened/irritated stop using the CHG and inform your nurse when you arrive at Short Stay. Do not shave (including legs and underarms) for at least 48 hours prior to the first CHG shower.  You may shave your face/neck. Please follow these instructions carefully:  1.  Shower with CHG Soap the night before surgery and the  morning of Surgery.  2.  If you choose to wash your hair, wash your hair first as usual with your  normal  shampoo.  3.  After you shampoo, rinse your hair and body thoroughly to remove the  shampoo.                           4.  Use CHG as you would any other liquid soap.  You can apply chg directly  to the skin and wash                       Gently with a scrungie or clean washcloth.  5.  Apply the CHG Soap to your body ONLY FROM THE NECK DOWN.   Do not use on face/ open  Wound or open sores. Avoid contact with eyes, ears mouth and genitals (private parts).                       Wash face,  Genitals (private parts) with your normal soap.             6.  Wash thoroughly, paying special attention to the area where your surgery  will be performed.  7.  Thoroughly rinse your body with warm water from the neck down.  8.  DO NOT shower/wash with your normal soap after using and rinsing off  the CHG Soap.                9.  Pat yourself dry with a clean towel.            10.  Wear clean pajamas.            11.  Place clean sheets on your bed the night of your first shower and do not  sleep with pets. Day of Surgery : Do not apply any lotions/deodorants the morning of surgery.  Please wear clean clothes to the hospital/surgery center.  FAILURE TO FOLLOW THESE INSTRUCTIONS MAY RESULT IN THE CANCELLATION OF YOUR SURGERY PATIENT SIGNATURE_________________________________  NURSE  SIGNATURE__________________________________  ________________________________________________________________________

## 2021-09-22 ENCOUNTER — Other Ambulatory Visit: Payer: Self-pay

## 2021-09-22 ENCOUNTER — Encounter (HOSPITAL_COMMUNITY)
Admission: RE | Admit: 2021-09-22 | Discharge: 2021-09-22 | Disposition: A | Discharge status: Home / Self Care | Payer: Medicaid Other | Source: Ambulatory Visit | Attending: Urology | Admitting: Urology

## 2021-09-22 ENCOUNTER — Encounter (HOSPITAL_COMMUNITY): Payer: Self-pay

## 2021-09-22 VITALS — BP 117/70 | HR 70 | Temp 98.4°F | Resp 16 | Ht 65.0 in | Wt 170.0 lb

## 2021-09-22 DIAGNOSIS — Z01818 Encounter for other preprocedural examination: Secondary | ICD-10-CM

## 2021-09-22 DIAGNOSIS — Z01812 Encounter for preprocedural laboratory examination: Secondary | ICD-10-CM | POA: Diagnosis not present

## 2021-09-22 HISTORY — DX: Personal history of urinary calculi: Z87.442

## 2021-09-22 HISTORY — DX: Gastro-esophageal reflux disease without esophagitis: K21.9

## 2021-09-22 HISTORY — DX: Anxiety disorder, unspecified: F41.9

## 2021-09-22 LAB — BASIC METABOLIC PANEL
Anion gap: 7 (ref 5–15)
BUN: 9 mg/dL (ref 6–20)
CO2: 26 mmol/L (ref 22–32)
Calcium: 8.7 mg/dL — ABNORMAL LOW (ref 8.9–10.3)
Chloride: 106 mmol/L (ref 98–111)
Creatinine, Ser: 0.68 mg/dL (ref 0.44–1.00)
GFR, Estimated: >60 mL/min (ref >60)
Glucose, Bld: 121 mg/dL — ABNORMAL HIGH (ref 70–99)
Potassium: 3.6 mmol/L (ref 3.5–5.1)
Sodium: 139 mmol/L (ref 135–145)

## 2021-09-22 LAB — CBC
HCT: 40 % (ref 36.0–46.0)
Hemoglobin: 13.2 g/dL (ref 12.0–15.0)
MCH: 30.7 pg (ref 26.0–34.0)
MCHC: 33 g/dL (ref 30.0–36.0)
MCV: 93 fL (ref 80.0–100.0)
Platelets: 235 10*3/uL (ref 150–400)
RBC: 4.3 MIL/uL (ref 3.87–5.11)
RDW: 12.2 % (ref 11.5–15.5)
WBC: 7.3 10*3/uL (ref 4.0–10.5)
nRBC: 0 % (ref 0.0–0.2)

## 2021-09-27 NOTE — Anesthesia Preprocedure Evaluation (Addendum)
Anesthesia Evaluation  Patient identified by MRN, date of birth, ID band Patient awake    Reviewed: Allergy & Precautions, NPO status , Patient's Chart, lab work & pertinent test results  History of Anesthesia Complications Negative for: history of anesthetic complications  Airway Mallampati: III  TM Distance: >3 FB Neck ROM: Full    Dental  (+) Dental Advisory Given, Teeth Intact Permanent retainer, broken:   Pulmonary neg pulmonary ROS, Patient abstained from smoking.,    Pulmonary exam normal        Cardiovascular negative cardio ROS Normal cardiovascular exam     Neuro/Psych  Headaches, Anxiety Depression    GI/Hepatic Neg liver ROS, GERD  ,  Endo/Other  negative endocrine ROS  Renal/GU Renal disease (bilateral renal and right ureteral stones)  negative genitourinary   Musculoskeletal negative musculoskeletal ROS (+)   Abdominal   Peds  Hematology negative hematology ROS (+)   Anesthesia Other Findings   Reproductive/Obstetrics                           Anesthesia Physical Anesthesia Plan  ASA: 2  Anesthesia Plan: General   Post-op Pain Management: Tylenol PO (pre-op)* and Toradol IV (intra-op)*   Induction: Intravenous  PONV Risk Score and Plan: 3 and Ondansetron, Dexamethasone, Midazolam and Treatment may vary due to age or medical condition  Airway Management Planned: LMA  Additional Equipment: None  Intra-op Plan:   Post-operative Plan: Extubation in OR  Informed Consent: I have reviewed the patients History and Physical, chart, labs and discussed the procedure including the risks, benefits and alternatives for the proposed anesthesia with the patient or authorized representative who has indicated his/her understanding and acceptance.     Dental advisory given  Plan Discussed with:   Anesthesia Plan Comments:        Anesthesia Quick Evaluation

## 2021-09-28 ENCOUNTER — Encounter (HOSPITAL_COMMUNITY): Payer: Self-pay | Admitting: Urology

## 2021-09-28 ENCOUNTER — Other Ambulatory Visit: Payer: Self-pay

## 2021-09-28 ENCOUNTER — Ambulatory Visit (HOSPITAL_COMMUNITY): Payer: Medicaid Other

## 2021-09-28 ENCOUNTER — Ambulatory Visit (HOSPITAL_COMMUNITY): Payer: Medicaid Other | Admitting: Physician Assistant

## 2021-09-28 ENCOUNTER — Ambulatory Visit (HOSPITAL_COMMUNITY)
Admission: RE | Admit: 2021-09-28 | Discharge: 2021-09-28 | Disposition: A | Discharge status: Home / Self Care | Payer: Medicaid Other | Attending: Urology | Admitting: Urology

## 2021-09-28 ENCOUNTER — Ambulatory Visit (HOSPITAL_BASED_OUTPATIENT_CLINIC_OR_DEPARTMENT_OTHER): Payer: Medicaid Other | Admitting: Anesthesiology

## 2021-09-28 ENCOUNTER — Encounter (HOSPITAL_COMMUNITY)
Admission: RE | Disposition: A | Discharge status: Home / Self Care | Payer: Self-pay | Source: Home / Self Care | Attending: Urology

## 2021-09-28 DIAGNOSIS — Z87891 Personal history of nicotine dependence: Secondary | ICD-10-CM | POA: Insufficient documentation

## 2021-09-28 DIAGNOSIS — N200 Calculus of kidney: Secondary | ICD-10-CM | POA: Diagnosis present

## 2021-09-28 DIAGNOSIS — Z87442 Personal history of urinary calculi: Secondary | ICD-10-CM | POA: Insufficient documentation

## 2021-09-28 DIAGNOSIS — Z01818 Encounter for other preprocedural examination: Secondary | ICD-10-CM

## 2021-09-28 DIAGNOSIS — F419 Anxiety disorder, unspecified: Secondary | ICD-10-CM | POA: Diagnosis not present

## 2021-09-28 DIAGNOSIS — F32A Depression, unspecified: Secondary | ICD-10-CM | POA: Insufficient documentation

## 2021-09-28 DIAGNOSIS — K219 Gastro-esophageal reflux disease without esophagitis: Secondary | ICD-10-CM | POA: Diagnosis not present

## 2021-09-28 HISTORY — PX: CYSTOSCOPY/URETEROSCOPY/HOLMIUM LASER/STENT PLACEMENT: SHX6546

## 2021-09-28 LAB — POCT PREGNANCY, URINE: Preg Test, Ur: NEGATIVE

## 2021-09-28 SURGERY — CYSTOSCOPY/URETEROSCOPY/HOLMIUM LASER/STENT PLACEMENT
Anesthesia: General | Laterality: Right

## 2021-09-28 MED ORDER — DEXAMETHASONE SODIUM PHOSPHATE 10 MG/ML IJ SOLN
INTRAMUSCULAR | Status: DC | PRN
  Administered 2021-09-28: 10 mg via INTRAVENOUS

## 2021-09-28 MED ORDER — LIDOCAINE HCL (PF) 2 % IJ SOLN
INTRAMUSCULAR | Status: AC
  Filled 2021-09-28: qty 5

## 2021-09-28 MED ORDER — OXYCODONE HCL 5 MG/5ML PO SOLN: 5.0000 mg | Freq: Once | ORAL | Status: AC | PRN

## 2021-09-28 MED ORDER — FENTANYL CITRATE (PF) 100 MCG/2ML IJ SOLN
INTRAMUSCULAR | Status: DC | PRN
  Administered 2021-09-28 (×2): 50 ug via INTRAVENOUS

## 2021-09-28 MED ORDER — ORAL CARE MOUTH RINSE: 15.0000 mL | Freq: Once | OROMUCOSAL | Status: AC

## 2021-09-28 MED ORDER — CEFAZOLIN SODIUM-DEXTROSE 2-4 GM/100ML-% IV SOLN
INTRAVENOUS | Status: AC
  Filled 2021-09-28: qty 100

## 2021-09-28 MED ORDER — KETOROLAC TROMETHAMINE 30 MG/ML IJ SOLN
INTRAMUSCULAR | Status: AC
  Filled 2021-09-28: qty 1

## 2021-09-28 MED ORDER — LACTATED RINGERS IV SOLN: INTRAVENOUS | Status: DC

## 2021-09-28 MED ORDER — FENTANYL CITRATE PF 50 MCG/ML IJ SOSY
PREFILLED_SYRINGE | INTRAMUSCULAR | Status: AC
  Filled 2021-09-28: qty 2

## 2021-09-28 MED ORDER — FENTANYL CITRATE PF 50 MCG/ML IJ SOSY
25.0000 ug | PREFILLED_SYRINGE | INTRAMUSCULAR | Status: DC | PRN
  Administered 2021-09-28 (×2): 50 ug via INTRAVENOUS

## 2021-09-28 MED ORDER — CEFAZOLIN SODIUM-DEXTROSE 2-4 GM/100ML-% IV SOLN
2.0000 g | INTRAVENOUS | Status: AC
  Administered 2021-09-28: 2 g via INTRAVENOUS

## 2021-09-28 MED ORDER — LIDOCAINE HCL (CARDIAC) PF 100 MG/5ML IV SOSY
PREFILLED_SYRINGE | INTRAVENOUS | Status: DC | PRN
  Administered 2021-09-28: 100 mg via INTRAVENOUS

## 2021-09-28 MED ORDER — FENTANYL CITRATE (PF) 100 MCG/2ML IJ SOLN
INTRAMUSCULAR | Status: AC
  Filled 2021-09-28: qty 2

## 2021-09-28 MED ORDER — PROPOFOL 10 MG/ML IV BOLUS
INTRAVENOUS | Status: AC
  Filled 2021-09-28: qty 20

## 2021-09-28 MED ORDER — CHLORHEXIDINE GLUCONATE 0.12 % MT SOLN
15.0000 mL | Freq: Once | OROMUCOSAL | Status: AC
  Administered 2021-09-28: 15 mL via OROMUCOSAL

## 2021-09-28 MED ORDER — PROPOFOL 10 MG/ML IV BOLUS
INTRAVENOUS | Status: DC | PRN
  Administered 2021-09-28: 200 mg via INTRAVENOUS

## 2021-09-28 MED ORDER — OXYCODONE HCL 5 MG PO TABS
ORAL_TABLET | ORAL | Status: AC
  Filled 2021-09-28: qty 1

## 2021-09-28 MED ORDER — IOHEXOL 300 MG/ML  SOLN
INTRAMUSCULAR | Status: DC | PRN
  Administered 2021-09-28: 6 mL

## 2021-09-28 MED ORDER — MIDAZOLAM HCL 5 MG/5ML IJ SOLN
INTRAMUSCULAR | Status: DC | PRN
  Administered 2021-09-28: 2 mg via INTRAVENOUS

## 2021-09-28 MED ORDER — SODIUM CHLORIDE 0.9 % IR SOLN
Status: DC | PRN
  Administered 2021-09-28: 3000 mL

## 2021-09-28 MED ORDER — AMISULPRIDE (ANTIEMETIC) 5 MG/2ML IV SOLN: 10.0000 mg | Freq: Once | INTRAVENOUS | Status: DC | PRN

## 2021-09-28 MED ORDER — MIDAZOLAM HCL 2 MG/2ML IJ SOLN
INTRAMUSCULAR | Status: AC
  Filled 2021-09-28: qty 2

## 2021-09-28 MED ORDER — OXYCODONE HCL 5 MG PO TABS: 5.0000 mg | ORAL_TABLET | ORAL | 0 refills | Status: DC | PRN

## 2021-09-28 MED ORDER — KETOROLAC TROMETHAMINE 30 MG/ML IJ SOLN
INTRAMUSCULAR | Status: DC | PRN
  Administered 2021-09-28: 30 mg via INTRAVENOUS

## 2021-09-28 MED ORDER — ONDANSETRON HCL 4 MG/2ML IJ SOLN: 4.0000 mg | Freq: Once | INTRAMUSCULAR | Status: DC | PRN

## 2021-09-28 MED ORDER — 0.9 % SODIUM CHLORIDE (POUR BTL) OPTIME
TOPICAL | Status: DC | PRN
  Administered 2021-09-28: 1000 mL

## 2021-09-28 MED ORDER — ONDANSETRON HCL 4 MG/2ML IJ SOLN
INTRAMUSCULAR | Status: AC
  Filled 2021-09-28: qty 2

## 2021-09-28 MED ORDER — DEXAMETHASONE SODIUM PHOSPHATE 10 MG/ML IJ SOLN
INTRAMUSCULAR | Status: AC
  Filled 2021-09-28: qty 1

## 2021-09-28 MED ORDER — OXYCODONE HCL 5 MG PO TABS
5.0000 mg | ORAL_TABLET | Freq: Once | ORAL | Status: AC | PRN
  Administered 2021-09-28: 5 mg via ORAL

## 2021-09-28 MED ORDER — ONDANSETRON HCL 4 MG/2ML IJ SOLN
INTRAMUSCULAR | Status: DC | PRN
  Administered 2021-09-28: 4 mg via INTRAVENOUS

## 2021-09-28 MED ORDER — ACETAMINOPHEN 500 MG PO TABS
1000.0000 mg | ORAL_TABLET | Freq: Once | ORAL | Status: AC
  Administered 2021-09-28: 1000 mg via ORAL
  Filled 2021-09-28: qty 2

## 2021-09-28 SURGICAL SUPPLY — 23 items
BAG URO CATCHER STRL LF (MISCELLANEOUS) ×2 IMPLANT
BASKET LASER NITINOL 1.9FR (BASKET) IMPLANT
BASKET ZERO TIP NITINOL 2.4FR (BASKET) ×1 IMPLANT
CATH URETL OPEN END 6FR 70 (CATHETERS) ×2 IMPLANT
CLOTH BEACON ORANGE TIMEOUT ST (SAFETY) ×2 IMPLANT
EXTRACTOR STONE 1.7FRX115CM (UROLOGICAL SUPPLIES) IMPLANT
GLOVE BIO SURGEON STRL SZ7.5 (GLOVE) ×2 IMPLANT
GOWN STRL REUS W/ TWL XL LVL3 (GOWN DISPOSABLE) ×1 IMPLANT
GOWN STRL REUS W/TWL XL LVL3 (GOWN DISPOSABLE) ×2
GUIDEWIRE ANG ZIPWIRE 038X150 (WIRE) IMPLANT
GUIDEWIRE STR DUAL SENSOR (WIRE) ×3 IMPLANT
KIT TURNOVER KIT A (KITS) IMPLANT
LASER FIB FLEXIVA PULSE ID 365 (Laser) IMPLANT
MANIFOLD NEPTUNE II (INSTRUMENTS) ×2 IMPLANT
PACK CYSTO (CUSTOM PROCEDURE TRAY) ×2 IMPLANT
SHEATH NAVIGATOR HD 11/13X28 (SHEATH) IMPLANT
SHEATH NAVIGATOR HD 11/13X36 (SHEATH) IMPLANT
SHEATH NAVIGATOR HD 12/14X46 (SHEATH) ×1 IMPLANT
STENT URET 6FRX24 CONTOUR (STENTS) ×1 IMPLANT
TRACTIP FLEXIVA PULS ID 200XHI (Laser) IMPLANT
TRACTIP FLEXIVA PULSE ID 200 (Laser) ×2
TUBING CONNECTING 10 (TUBING) ×2 IMPLANT
TUBING UROLOGY SET (TUBING) ×2 IMPLANT

## 2021-09-28 NOTE — Op Note (Signed)
Operative Note  Preoperative diagnosis:  1.  Right renal and ureteral calculi  Postoperative diagnosis: 1.  Right renal calculi  Procedure(s): 1.  Cystoscopy with right retrograde pyelogram, right ureteroscopy with laser lithotripsy and stone basketing, right ureteral stent placement  Surgeon: Modena Slater, MD  Assistants: None  Anesthesia: General  Complications: None immediate  EBL: Minimal  Specimens: 1.  Renal calculus  Drains/Catheters: 1.  6 x 24 double-J ureteral stent  Intraoperative findings: 1.  Normal urethra and bladder 2.  Right retrograde pyelogram revealed no hydronephrosis.  Ureteroscopy revealed no ureteral calculi.  She had several stones within the kidney that were laser fragmented to tiny fragments in a couple of larger fragments were basket extracted.  Indication: 26 year old female with a history of nephrolithiasis.  She has bilateral renal calculi.  She had a right ureteral calculus and persistent right-sided flank pain as well as multiple right renal calculi.  Larger stone burden was also on the right.  She had a procedure when she was 14 and did not tolerate the stent well.  She was apparently told that she was allergic to the stent.  We discussed the risk.  She had the stent in place for 1 day at that time and she had no shortness of breath or rash.  She did have some severe vomiting which was the primary reason for removal.  After full informed discussion of stent placement, she elected to proceed understanding the potential risks.  Description of procedure:  The patient was identified and consent was obtained.  The patient was taken to the operating room and placed in the supine position.  The patient was placed under general anesthesia.  Perioperative antibiotics were administered.  The patient was placed in dorsal lithotomy.  Patient was prepped and draped in a standard sterile fashion and a timeout was performed.  A 21 French rigid cystoscope was  advanced into the urethra and into the bladder.  Complete cystoscopy was performed with findings noted above.  The right ureter was cannulated with a sensor wire which was advanced up to the kidney under fluoroscopic guidance.  Semirigid ureteroscopy was performed alongside the wire up to the renal pelvis.  She had no ureteral calculi.  I passed a second wire through the scope and into the kidney under fluoroscopic guidance.  Scope was withdrawn and a 12 x 14 ureteral access sheath was advanced over 1 the wires and up the ureter up to the renal pelvis.  Inner sheath and wire were withdrawn.  Digital ureteroscopy was performed and multiple stones were treated with laser fragmentation on dust settings.  All stone was treated.  Basket was used to extract a couple of larger stones until only tiny stones remained too small to basket extract.  Fluoroscopy confirmed treatment of all stones.  I shot a retrograde pyelogram through the scope with the findings noted above.  I withdrew the scope along with the access sheath visualizing the entire ureter upon removal.  There were no ureteral calculi and no ureteral injury was identified.  I backloaded the wire onto rigid cystoscope and advanced that into the bladder followed by routine placement of a 6 x 24 double-J ureteral stent.  Fluoroscopy confirmed proximal placement and direct visualization confirmed a good coil within the bladder.  I drained the bladder and withdrew the scope.  Patient tolerated the procedure well was stable postoperative.  Plan: Follow-up in 4 to 7 days for stent removal.

## 2021-09-28 NOTE — Anesthesia Postprocedure Evaluation (Signed)
Anesthesia Post Note  Patient: Holly Jacobs  Procedure(s) Performed: CYSTOSCOPY BILATERAL/URETEROSCOPY/HOLMIUM LASER/STENT PLACEMENT (Right)     Patient location during evaluation: PACU Anesthesia Type: General Level of consciousness: awake and alert Pain management: pain level controlled Vital Signs Assessment: post-procedure vital signs reviewed and stable Respiratory status: spontaneous breathing, nonlabored ventilation and respiratory function stable Cardiovascular status: blood pressure returned to baseline and stable Postop Assessment: no apparent nausea or vomiting Anesthetic complications: no   No notable events documented.  Last Vitals:  Vitals:   09/28/21 0930 09/28/21 0945  BP: 119/85 123/85  Pulse: 69 67  Resp: 12   Temp:    SpO2: 96% 98%    Last Pain:  Vitals:   09/28/21 0929  TempSrc:   PainSc: 5                  Lucretia Kern

## 2021-09-28 NOTE — H&P (Signed)
CC/HPI: CC: Renal and ureteral calculi  HPI:  09/09/2021  26 year old female went to the emergency department on 09/04/2021 and underwent a CT scan of the abdomen and pelvis that revealed bilateral renal calculi. Larger stone burden was on the right. She also had some right hydronephrosis with a proximal right ureteral calculus. She continues to have some intermittent right-sided abdominal discomfort and pain. She is allergic to Flomax and sulfa. She had some itching with the Flomax. She states that she has had a ureteroscopy with stent placement in the past when she was about 15. She had a severe reaction to the stent and had to have it removed the next day. Unclear what kind of stent she had placed.     ALLERGIES: Latex Sulfa - Anaphylaxis Sumatriptan - Hives Tramadol Hcl - Hives    MEDICATIONS: Aleve  Oxycodone Hcl 5 mg tablet     GU PSH: None     PSH Notes: Kidney stone removal   NON-GU PSH: None   GU PMH: None   NON-GU PMH: Anxiety Depression Hypertension    FAMILY HISTORY: 2 daughters - Other   SOCIAL HISTORY: Marital Status: Single Preferred Language: English; Race: White Current Smoking Status: Patient has never smoked.   Tobacco Use Assessment Completed: Used Tobacco in last 30 days? Drinks 1 caffeinated drink per day.    REVIEW OF SYSTEMS:    GU Review Female:   Patient reports frequent urination, hard to postpone urination, burning /pain with urination, get up at night to urinate, and have to strain to urinate. Patient denies leakage of urine, stream starts and stops, trouble starting your stream, and being pregnant.  Gastrointestinal (Upper):   Patient reports nausea and vomiting. Patient denies indigestion/ heartburn.  Gastrointestinal (Lower):   Patient reports diarrhea and constipation.   Constitutional:   Patient reports fever, night sweats, weight loss, and fatigue.   Skin:   Patient denies skin rash/ lesion and itching.  Eyes:   Patient denies blurred  vision and double vision.  Ears/ Nose/ Throat:   Patient reports sore throat and sinus problems.   Hematologic/Lymphatic:   Patient denies swollen glands and easy bruising.  Cardiovascular:   Patient denies chest pains and leg swelling.  Respiratory:   Patient denies cough and shortness of breath.  Endocrine:   Patient denies excessive thirst.  Musculoskeletal:   Patient reports back pain. Patient denies joint pain.  Neurological:   Patient reports headaches. Patient denies dizziness.  Psychologic:   Patient reports anxiety. Patient denies depression.   Notes: hematuria    VITAL SIGNS:      09/09/2021 03:14 PM  Weight 165 lb / 74.84 kg  Height 65 in / 165.1 cm  BP 111/73 mmHg  Heart Rate 72 /min  Temperature 98.4 F / 36.8 C  BMI 27.5 kg/m   MULTI-SYSTEM PHYSICAL EXAMINATION:    Constitutional: Well-nourished. No physical deformities. Normally developed. Good grooming.  Respiratory: No labored breathing, no use of accessory muscles.   Cardiovascular: Normal temperature, normal extremity pulses, no swelling, no varicosities.  Skin: No paleness, no jaundice, no cyanosis. No lesion, no ulcer, no rash.  Neurologic / Psychiatric: Oriented to time, oriented to place, oriented to person. No depression, no anxiety, no agitation.  Gastrointestinal: No mass, no tenderness, no rigidity, non obese abdomen.  Eyes: Normal conjunctivae. Normal eyelids.  Musculoskeletal: Normal gait and station of head and neck.     Complexity of Data:  Source Of History:  Patient  Records Review:  Previous Doctor Records, Previous Patient Records  Urine Test Review:   Urinalysis  X-Ray Review: C.T. Abdomen/Pelvis: Reviewed Films. Reviewed Report. Discussed With Patient.     PROCEDURES:          Urinalysis w/Scope Dipstick Dipstick Cont'd Micro  Color: Yellow Bilirubin: Neg mg/dL WBC/hpf: 0 - 5/hpf  Appearance: Clear Ketones: Neg mg/dL RBC/hpf: 3 - 29/VBT  Specific Gravity: 1.025 Blood: Trace ery/uL  Bacteria: NS (Not Seen)  pH: 7.0 Protein: Neg mg/dL Cystals: Amorph Phosphates  Glucose: Neg mg/dL Urobilinogen: 0.2 mg/dL Casts: NS (Not Seen)    Nitrites: Neg Trichomonas: Not Present    Leukocyte Esterase: Neg leu/uL Mucous: Present      Epithelial Cells: 0 - 5/hpf      Yeast: NS (Not Seen)      Sperm: Not Present    ASSESSMENT:      ICD-10 Details  1 GU:   Renal and ureteral calculus - N20.2 Undiagnosed New Problem  2   Ureteral obstruction secondary to calculous - N13.2 Undiagnosed New Problem   PLAN:            Medications New Meds: Oxycodone Hcl 5 mg tablet 1 tablet PO Q 6 H PRN For pain  #10  0 Refill(s)  Pharmacy Name:  [facility]  Address:  [address]   [place], [place] [address]  Phone:  [phone]  Fax:  [phone]            Orders Labs Urine Culture          Document Letter(s):  Created for Patient: Clinical Summary         Notes:   We discussed the management of urinary stones. These options include observation, ureteroscopy, and shockwave lithotripsy. We discussed which options are relevant to these particular stones. We discussed the natural history of stones as well as the complications of untreated stones and the impact on quality of life without treatment as well as with each of the above listed treatments. We also discussed the efficacy of each treatment in its ability to clear the stone burden. With any of these management options I discussed the signs and symptoms of infection and the need for emergent treatment should these be experienced. For each option we discussed the ability of each procedure to clear the patient of their stone burden.   For observation I described the risks which include but are not limited to silent renal damage, life-threatening infection, need for emergent surgery, failure to pass stone, and pain.   For ureteroscopy I described the risks which include heart attack, stroke, pulmonary embolus, death, bleeding,  infection, damage to contiguous structures, positioning injury, ureteral stricture, ureteral avulsion, ureteral injury, need for ureteral stent, inability to perform ureteroscopy, need for an interval procedure, inability to clear stone burden, stent discomfort and pain.   For shockwave lithotripsy I described the risks which include arrhythmia, kidney contusion, kidney hemorrhage, need for transfusion, pain, inability to break up stone, inability to pass stone fragments, Steinstrasse, infection associated with obstructing stones, need for different surgical procedure, need for repeat shockwave lithotripsy.   She would like proceed with bilateral ureteroscopy. We will concentrate on the right first and if this goes well proceed with the left. She understands the potential for staged procedure. She understands the need for ureteral stent. I cannot guarantee that she will not have a severe reaction to the ureteral stent but it is necessary. Also discussed percutaneous nephrolithotomy as an alternative  but this does carry increased risk such as bleeding requiring blood transfusion, infection, ureteral avulsion, need for interventional radiology intervention, overnight hospital stay, etc.    Signed by Modena Slater, III, M.D. on 09/09/21 at 3:37 PM (EDT)     APPENDED NOTES:    I talked to the patient some more about her history of what she feels like was an allergic reaction to the stent. We discussed that this procedure typically requires a stent to prevent stricture formation and to allow for ureteral healing with the instrumentation required. She would likely require a sheath and the stent will help with healing. That being said, I cannot guarantee that she will not have an allergic reaction to the stent. After full informed discussion, she elects to proceed with ureteroscopy with likely stent placement with the understanding that there is a possibility she has a reaction to it.   Since we do not know  her reaction to the stent, I think it is best to perform a staged operation and only perform a right ureteroscopy to ensure this goes well rather than perform bilateral. This will ensure that if there is a complication or reaction and the stent has come out early, the left ureter is unaffected.    Signed by [facility] Resident) on 09/11/21 at 6:49 PM (EDT)      The above signature should read Modena Slater. Urochart signed it as Dr. Gillian Scarce who was not involved in the care of this patient. He has not seen the patient.    Signed by Modena Slater, III, M.D. on 09/11/21 at 6:54 PM (EDT

## 2021-09-28 NOTE — OR Nursing (Signed)
Stone taken by Dr. Bell. 

## 2021-09-28 NOTE — Discharge Instructions (Addendum)

## 2021-09-28 NOTE — Transfer of Care (Signed)
Immediate Anesthesia Transfer of Care Note  Patient: Holly Jacobs  Procedure(s) Performed: CYSTOSCOPY BILATERAL/URETEROSCOPY/HOLMIUM LASER/STENT PLACEMENT (Right)  Patient Location: PACU  Anesthesia Type:General  Level of Consciousness: sedated  Airway & Oxygen Therapy: Patient Spontanous Breathing and Patient connected to face mask oxygen  Post-op Assessment: Report given to RN and Post -op Vital signs reviewed and stable  Post vital signs: Reviewed and stable  Last Vitals:  Vitals Value Taken Time  BP 110/58 09/28/21 0839  Temp    Pulse 71 09/28/21 0840  Resp 20 09/28/21 0840  SpO2 100 % 09/28/21 0840  Vitals shown include unvalidated device data.  Last Pain:  Vitals:   09/28/21 0600  TempSrc:   PainSc: 3       Patients Stated Pain Goal: 0 (09/28/21 0600)  Complications: No notable events documented.

## 2021-09-28 NOTE — Anesthesia Procedure Notes (Signed)
Procedure Name: LMA Insertion Date/Time: 09/28/2021 7:36 AM  Performed by: Doran Clay, CRNAPre-anesthesia Checklist: Patient identified, Emergency Drugs available, Suction available, Patient being monitored and Timeout performed Patient Re-evaluated:Patient Re-evaluated prior to induction Oxygen Delivery Method: Circle system utilized Preoxygenation: Pre-oxygenation with 100% oxygen Induction Type: IV induction LMA: LMA inserted LMA Size: 4.0 Tube type: Oral Number of attempts: 1 Placement Confirmation: positive ETCO2 and breath sounds checked- equal and bilateral Tube secured with: Tape Dental Injury: Teeth and Oropharynx as per pre-operative assessment

## 2021-09-29 ENCOUNTER — Encounter (HOSPITAL_COMMUNITY): Payer: Self-pay | Admitting: Urology

## 2021-10-06 ENCOUNTER — Other Ambulatory Visit: Payer: Self-pay | Admitting: Urology

## 2021-11-16 ENCOUNTER — Encounter (HOSPITAL_BASED_OUTPATIENT_CLINIC_OR_DEPARTMENT_OTHER): Payer: Self-pay | Admitting: Urology

## 2021-11-27 ENCOUNTER — Ambulatory Visit (HOSPITAL_BASED_OUTPATIENT_CLINIC_OR_DEPARTMENT_OTHER): Admit: 2021-11-27 | Payer: Medicaid Other | Admitting: Urology

## 2021-11-27 HISTORY — DX: Calculus of kidney: N20.0

## 2021-11-27 SURGERY — CYSTOURETEROSCOPY, WITH RETROGRADE PYELOGRAM AND STENT INSERTION
Anesthesia: General | Laterality: Bilateral

## 2023-02-23 NOTE — L&D Delivery Note (Signed)
 DELIVERY NOTE  Pt complete and at +2 station with urge to push. Epidural controlling pain. Pt pushed and delivered a viable female infant in LOA position. Nuchal x1, reduced without issue. Anterior and posterior shoulders spontaneously delivered with next two pushes; body easily followed next. Infant placed on mothers abdomen and bulb suction of mouth and nose performed. Cord was then clamped and cut by FOB. Cord blood obtained, 3VC. Baby had a vigorous spontaneous cry noted. Placenta then delivered at 0443 intact. Fundal massage performed and pitocin  per protocol. Fundus firm. The following lacerations were noted: NONE, EBL 350cc. Mother and baby stable. Counts correct   Infant time: 42 Gender: female, DESIRES CIRC Placenta time: 0443 Apgars: 7/9 Weight: pending skin-to-skin

## 2023-03-18 LAB — OB RESULTS CONSOLE GC/CHLAMYDIA
Chlamydia: NEGATIVE
Neisseria Gonorrhea: NEGATIVE

## 2023-03-18 LAB — OB RESULTS CONSOLE HIV ANTIBODY (ROUTINE TESTING): HIV: NONREACTIVE

## 2023-03-18 LAB — OB RESULTS CONSOLE RPR: RPR: NONREACTIVE

## 2023-03-18 LAB — OB RESULTS CONSOLE RUBELLA ANTIBODY, IGM: Rubella: NON-IMMUNE/NOT IMMUNE

## 2023-03-18 LAB — OB RESULTS CONSOLE HEPATITIS B SURFACE ANTIGEN: Hepatitis B Surface Ag: NEGATIVE

## 2023-04-28 ENCOUNTER — Encounter (HOSPITAL_COMMUNITY): Payer: Self-pay | Admitting: *Deleted

## 2023-04-28 ENCOUNTER — Inpatient Hospital Stay (HOSPITAL_COMMUNITY)
Admission: AD | Admit: 2023-04-28 | Discharge: 2023-04-28 | Discharge status: Against Medical Advice | Attending: Student | Admitting: Student

## 2023-04-28 DIAGNOSIS — R000 Tachycardia, unspecified: Secondary | ICD-10-CM | POA: Diagnosis present

## 2023-04-28 DIAGNOSIS — R0602 Shortness of breath: Secondary | ICD-10-CM | POA: Insufficient documentation

## 2023-04-28 DIAGNOSIS — O26891 Other specified pregnancy related conditions, first trimester: Secondary | ICD-10-CM | POA: Diagnosis present

## 2023-04-28 DIAGNOSIS — Z3A14 14 weeks gestation of pregnancy: Secondary | ICD-10-CM | POA: Diagnosis not present

## 2023-04-28 LAB — POCT PREGNANCY, URINE: Preg Test, Ur: POSITIVE — AB

## 2023-04-28 MED ORDER — ACETAMINOPHEN-CAFFEINE 500-65 MG PO TABS
2.0000 | ORAL_TABLET | Freq: Once | ORAL | Status: DC
  Filled 2023-04-28: qty 2

## 2023-04-28 NOTE — MAU Note (Addendum)
.  Holly Jacobs is a 28 y.o. at [redacted]w[redacted]d here in MAU reporting SOB for a week. Has awaken her the past 2 nights. When SOB happens she feels like her heart is racing. Occ headaches. Denies VB or LOF. Does have h/a now. Took Tylenol about 1600 which did not help. Does have hx migraines. Uses excedrin for migraines when not pregnant  LMP: does not know Onset of complaint: 1 week Pain score: 4 Vitals:   04/28/23 1953 04/28/23 1954  BP:  121/75  Pulse: 79   Resp: 17   Temp: 97.9 F (36.6 C)   SpO2: 100%      FHT: 161  Lab orders placed from triage: u/a

## 2023-04-28 NOTE — MAU Note (Signed)
 Pt called for Triage and was in lobby BR

## 2023-04-29 ENCOUNTER — Inpatient Hospital Stay (HOSPITAL_COMMUNITY)
Admission: AD | Admit: 2023-04-29 | Discharge: 2023-04-29 | Discharge status: Against Medical Advice | Payer: Self-pay | Attending: Obstetrics and Gynecology | Admitting: Obstetrics and Gynecology

## 2023-04-29 ENCOUNTER — Inpatient Hospital Stay (HOSPITAL_COMMUNITY)

## 2023-04-29 DIAGNOSIS — O26892 Other specified pregnancy related conditions, second trimester: Secondary | ICD-10-CM | POA: Diagnosis present

## 2023-04-29 DIAGNOSIS — Z3A14 14 weeks gestation of pregnancy: Secondary | ICD-10-CM | POA: Diagnosis not present

## 2023-04-29 DIAGNOSIS — R0602 Shortness of breath: Secondary | ICD-10-CM | POA: Diagnosis not present

## 2023-04-29 DIAGNOSIS — R079 Chest pain, unspecified: Secondary | ICD-10-CM | POA: Insufficient documentation

## 2023-04-29 LAB — CBC
HCT: 37.3 % (ref 36.0–46.0)
Hemoglobin: 12.9 g/dL (ref 12.0–15.0)
MCH: 31.2 pg (ref 26.0–34.0)
MCHC: 34.6 g/dL (ref 30.0–36.0)
MCV: 90.1 fL (ref 80.0–100.0)
Platelets: 264 10*3/uL (ref 150–400)
RBC: 4.14 MIL/uL (ref 3.87–5.11)
RDW: 12.3 % (ref 11.5–15.5)
WBC: 9.2 10*3/uL (ref 4.0–10.5)
nRBC: 0 % (ref 0.0–0.2)

## 2023-04-29 LAB — URINALYSIS, ROUTINE W REFLEX MICROSCOPIC
Bilirubin Urine: NEGATIVE
Glucose, UA: NEGATIVE mg/dL
Hgb urine dipstick: NEGATIVE
Ketones, ur: NEGATIVE mg/dL
Leukocytes,Ua: NEGATIVE
Nitrite: NEGATIVE
Protein, ur: NEGATIVE mg/dL
Specific Gravity, Urine: 1.011 (ref 1.005–1.030)
pH: 8 (ref 5.0–8.0)

## 2023-04-29 LAB — COMPREHENSIVE METABOLIC PANEL
ALT: 14 U/L (ref 0–44)
AST: 16 U/L (ref 15–41)
Albumin: 3.5 g/dL (ref 3.5–5.0)
Alkaline Phosphatase: 46 U/L (ref 38–126)
Anion gap: 10 (ref 5–15)
BUN: 5 mg/dL — ABNORMAL LOW (ref 6–20)
CO2: 22 mmol/L (ref 22–32)
Calcium: 8.9 mg/dL (ref 8.9–10.3)
Chloride: 104 mmol/L (ref 98–111)
Creatinine, Ser: 0.59 mg/dL (ref 0.44–1.00)
GFR, Estimated: >60 mL/min (ref >60)
Glucose, Bld: 117 mg/dL — ABNORMAL HIGH (ref 70–99)
Potassium: 3.8 mmol/L (ref 3.5–5.1)
Sodium: 136 mmol/L (ref 135–145)
Total Bilirubin: 0.4 mg/dL (ref 0.0–1.2)
Total Protein: 6.5 g/dL (ref 6.5–8.1)

## 2023-04-29 NOTE — Discharge Instructions (Signed)

## 2023-04-29 NOTE — MAU Note (Signed)
 RN called lab to inquire about  P/C Ratio . Lab tech, Tiffany, answered and informed RN that that they will try to locate the sample and contact the RN if they are unable to locate.Marland Kitchen

## 2023-04-29 NOTE — MAU Provider Note (Signed)
 Chief Complaint:  Shortness of Breath   HPI    Holly Jacobs is a 28 y.o. G3P0101 at [redacted]w[redacted]d who presents to maternity admissions reporting episodic SOB in pregnancy. She reports that over the past few weeks she has noticed at random times she experiences SOB which as woken her from sleep. Denies any Chest pain, N/V/D and reports that she does have a h/o HA's but hasn't needed to take any medications today for her HA. Denies any Fever, Chills, N/V/D. She denies recent travel and denies any recent sick contacts.   Pregnancy Course: [facility]  Past Medical History:  Diagnosis Date   Anxiety    Depression    GERD (gastroesophageal reflux disease)    History of kidney stones    Migraine    Renal calculus, left    OB History  Gravida Para Term Preterm AB Living  3 1 0 1 0 1  SAB IAB Ectopic Multiple Live Births  0 0 0 0 1    # Outcome Date GA Lbr Len/2nd Weight Sex Type Anes PTL Lv  3 Current           2 Preterm 05/01/19 [redacted]w[redacted]d 01:30 / 01:33 2466 g F Vag-Vacuum EPI  LIV  1 Gravida            Past Surgical History:  Procedure Laterality Date   CYSTOSCOPY W/ URETERAL STENT REMOVAL Right 12/30/2012   Procedure: CYSTOSCOPY WITH STENT REMOVAL;  Surgeon: Ky Barban, MD;  Location: [facility];  Service: Urology;  Laterality: Right;   CYSTOSCOPY WITH RETROGRADE PYELOGRAM, URETEROSCOPY AND STENT PLACEMENT Left 12/29/2012   Procedure: CYSTOSCOPY WITH LEFT RETROGRADE PYELOGRAM, BALLOON DILATION LEFT URETER; LEFT URETEROSCOPY ;  Surgeon: Ky Barban, MD;  Location: [facility];  Service: Urology;  Laterality: Left;   CYSTOSCOPY WITH STENT PLACEMENT Right 12/29/2012   Procedure: CYSTOSCOPY WITH STENT PLACEMENT RIGHT URETER;  Surgeon: Ky Barban, MD;  Location: [facility];  Service: Urology;  Laterality: Right;   CYSTOSCOPY/URETEROSCOPY/HOLMIUM LASER/STENT PLACEMENT Right 09/28/2021   Procedure: CYSTOSCOPY BILATERAL/URETEROSCOPY/HOLMIUM LASER/STENT PLACEMENT;  Surgeon: Crista Elliot, MD;  Location: [facility];  Service: Urology;  Laterality: Right;  90 MINS   Family History  Problem Relation Age of Onset   Migraines Father    Skin cancer Maternal Grandmother    Diabetes Maternal Grandmother    High Cholesterol Maternal Grandmother    Social History   Tobacco Use   Smoking status: Never   Smokeless tobacco: Never  Vaping Use   Vaping status: Some Days   Substances: Nicotine, CBD  Substance Use Topics   Alcohol use: No   Drug use: No   Allergies  Allergen Reactions   Imitrex [Sumatriptan Base] Anaphylaxis, Shortness Of Breath and Itching   Sulfa Antibiotics Anaphylaxis   Latex Hives   Tamsulosin Hcl Itching   Tramadol Hives   Other Itching and Rash    Pt says she had a previous reaction to the stent placement 11 yrs ago and her MD is aware  Pt also allergic to pre-wash done before procedure   No medications prior to admission.    I have reviewed patient's Past Medical Hx, Surgical Hx, Family Hx, Social Hx, medications and allergies.   ROS  Pertinent items noted in HPI and remainder of comprehensive ROS otherwise negative.   PHYSICAL EXAM  Patient Vitals for the past 24 hrs:  BP Temp Temp src Pulse Resp SpO2 Height Weight  04/29/23 1233 137/78 -- --  89 -- -- -- --  04/29/23 1230 124/73 -- -- 92 -- -- -- --  04/29/23 1229 129/76 -- -- 84 -- -- -- --  04/29/23 1228 127/77 -- -- 81 -- -- -- --  04/29/23 1222 136/82 98 F (36.7 C) Oral 74 18 100 % 5\' 5"  (1.651 m) 70.5 kg    Constitutional: Well-developed, well-nourished female in no acute distress.  Cardiovascular: normal rate & rhythm, warm and well-perfused Respiratory: normal effort, no problems with respiration noted, Lungs BCTA GI: Abd soft, non-tender MS: Extremities nontender, no edema, normal ROM Neurologic: Alert and oriented x 4.  GU: no CVA tenderness Pelvic: Deferred     Fetal Tracing: 155 via doppler   Labs: Results for orders placed or performed during the hospital  encounter of 04/29/23 (from the past 24 hours)  Urinalysis, Routine w reflex microscopic -Urine, Clean Catch     Status: Abnormal   Collection Time: 04/29/23 12:24 PM  Result Value Ref Range   Color, Urine YELLOW YELLOW   APPearance CLOUDY (A) CLEAR   Specific Gravity, Urine 1.011 1.005 - 1.030   pH 8.0 5.0 - 8.0   Glucose, UA NEGATIVE NEGATIVE mg/dL   Hgb urine dipstick NEGATIVE NEGATIVE   Bilirubin Urine NEGATIVE NEGATIVE   Ketones, ur NEGATIVE NEGATIVE mg/dL   Protein, ur NEGATIVE NEGATIVE mg/dL   Nitrite NEGATIVE NEGATIVE   Leukocytes,Ua NEGATIVE NEGATIVE  CBC     Status: None   Collection Time: 04/29/23  1:43 PM  Result Value Ref Range   WBC 9.2 4.0 - 10.5 K/uL   RBC 4.14 3.87 - 5.11 MIL/uL   Hemoglobin 12.9 12.0 - 15.0 g/dL   HCT 29.5 62.1 - 30.8 %   MCV 90.1 80.0 - 100.0 fL   MCH 31.2 26.0 - 34.0 pg   MCHC 34.6 30.0 - 36.0 g/dL   RDW 65.7 84.6 - 96.2 %   Platelets 264 150 - 400 K/uL   nRBC 0.0 0.0 - 0.2 %  Comprehensive metabolic panel     Status: Abnormal   Collection Time: 04/29/23  1:43 PM  Result Value Ref Range   Sodium 136 135 - 145 mmol/L   Potassium 3.8 3.5 - 5.1 mmol/L   Chloride 104 98 - 111 mmol/L   CO2 22 22 - 32 mmol/L   Glucose, Bld 117 (H) 70 - 99 mg/dL   BUN 5 (L) 6 - 20 mg/dL   Creatinine, Ser 9.52 0.44 - 1.00 mg/dL   Calcium 8.9 8.9 - 84.1 mg/dL   Total Protein 6.5 6.5 - 8.1 g/dL   Albumin 3.5 3.5 - 5.0 g/dL   AST 16 15 - 41 U/L   ALT 14 0 - 44 U/L   Alkaline Phosphatase 46 38 - 126 U/L   Total Bilirubin 0.4 0.0 - 1.2 mg/dL   GFR, Estimated >32 >44 mL/min   Anion gap 10 5 - 15    Imaging:  DG CHEST PORT 1 VIEW Result Date: 04/29/2023 CLINICAL DATA:  Shortness of breath. EXAM: PORTABLE CHEST 1 VIEW COMPARISON:  None Available. FINDINGS: The cardiomediastinal contours are normal. The lungs are clear. Pulmonary vasculature is normal. No consolidation, pleural effusion, or pneumothorax. No acute osseous abnormalities are seen. IMPRESSION: No  active disease. Electronically Signed   By: Narda Rutherford M.D.   On: 04/29/2023 17:04    MDM & MAU COURSE  MDM:  HIGH  Labs: Unremarkable EKG: Normal sinus rhythm CXR: No acute findings Orthostatic BP's Normal range   I  have reviewed the patient chart and performed the physical exam . I have ordered & interpreted the lab results Medications ordered as stated below.  A/P as described below.  Counseling and education provided and patient agreeable  with plan as described below. Verbalized understanding.    MAU Course: Orders Placed This Encounter  Procedures   DG CHEST PORT 1 VIEW   Urinalysis, Routine w reflex microscopic -Urine, Clean Catch   CBC   Comprehensive metabolic panel   Orthostatic vital signs   ED EKG   EKG 12-Lead     ASSESSMENT   1. SOB (shortness of breath)   2. [redacted] weeks gestation of pregnancy     PLAN  Patient left AMA prior to CXR resulted and signed AMA     Allergies as of 04/29/2023       Reactions   Imitrex [sumatriptan Base] Anaphylaxis, Shortness Of Breath, Itching   Sulfa Antibiotics Anaphylaxis   Latex Hives   Tamsulosin Hcl Itching   Tramadol Hives   Other Itching, Rash   Pt says she had a previous reaction to the stent placement 11 yrs ago and her MD is aware Pt also allergic to pre-wash done before procedure        Medication List     TAKE these medications    acetaminophen 500 MG tablet Commonly known as: TYLENOL Take 500 mg by mouth every 6 (six) hours as needed.   docusate sodium 100 MG capsule Commonly known as: COLACE Take 100 mg by mouth 2 (two) times daily.   etonogestrel 68 MG Impl implant Commonly known as: NEXPLANON 1 each by Subdermal route once.   NIFEdipine 60 MG 24 hr tablet Commonly known as: ADALAT CC Take 1 tablet (60 mg total) by mouth daily.   oxyCODONE 5 MG immediate release tablet Commonly known as: Roxicodone Take 1 tablet (5 mg total) by mouth every 4 (four) hours as needed for severe  pain.   prenatal multivitamin Tabs tablet Take 1 tablet by mouth daily at 12 noon.   prenatal multivitamin Tabs tablet Take 1 tablet by mouth daily at 12 noon.   tamsulosin 0.4 MG Caps capsule Commonly known as: Flomax Take 1 capsule (0.4 mg total) by mouth daily.   venlafaxine XR 75 MG 24 hr capsule Commonly known as: EFFEXOR-XR Take 1 capsule (75 mg total) by mouth daily with breakfast.        Marcell Barlow, MSN, [facility], Center for [facility]

## 2023-04-29 NOTE — MAU Note (Addendum)
.  Holly Jacobs is a 28 y.o. at [redacted]w[redacted]d here in MAU reporting: SOB for the past week. She reports the past two nights she has woken up out of her sleep feeling as if she cannot breathe. She reports in these moments she feels as if her heart is racing. Denies VB or LOF.   -Orthostatic VS and EKG performed during triage. She reports this happened in her previous pregnancy but it did not occur until her third trimester.  Onset of complaint: One week Pain score: Denies pain.  Vitals:   04/29/23 1222  BP: 136/82  Pulse: 74  Resp: 18  Temp: 98 F (36.7 C)  SpO2: 100%     FHT: 155 doppler Lab orders placed from triage: EKG, UA, Orthostatic VS

## 2023-07-05 LAB — OB RESULTS CONSOLE GBS: GBS: NEGATIVE

## 2023-07-22 ENCOUNTER — Encounter (HOSPITAL_COMMUNITY): Payer: Self-pay | Admitting: Obstetrics and Gynecology

## 2023-07-22 ENCOUNTER — Inpatient Hospital Stay (HOSPITAL_COMMUNITY)
Admission: AD | Admit: 2023-07-22 | Discharge: 2023-07-22 | Disposition: A | Discharge status: Home / Self Care | Attending: Obstetrics and Gynecology | Admitting: Obstetrics and Gynecology

## 2023-07-22 DIAGNOSIS — Z3A26 26 weeks gestation of pregnancy: Secondary | ICD-10-CM | POA: Diagnosis not present

## 2023-07-22 DIAGNOSIS — Z0371 Encounter for suspected problem with amniotic cavity and membrane ruled out: Secondary | ICD-10-CM | POA: Diagnosis present

## 2023-07-22 DIAGNOSIS — O26892 Other specified pregnancy related conditions, second trimester: Secondary | ICD-10-CM

## 2023-07-22 DIAGNOSIS — N898 Other specified noninflammatory disorders of vagina: Secondary | ICD-10-CM | POA: Diagnosis not present

## 2023-07-22 LAB — URINALYSIS, ROUTINE W REFLEX MICROSCOPIC
Bilirubin Urine: NEGATIVE
Glucose, UA: NEGATIVE mg/dL
Hgb urine dipstick: NEGATIVE
Ketones, ur: NEGATIVE mg/dL
Leukocytes,Ua: NEGATIVE
Nitrite: NEGATIVE
Protein, ur: NEGATIVE mg/dL
Specific Gravity, Urine: 1.011 (ref 1.005–1.030)
pH: 7 (ref 5.0–8.0)

## 2023-07-22 LAB — WET PREP, GENITAL
Clue Cells Wet Prep HPF POC: NONE SEEN
Sperm: NONE SEEN
Trich, Wet Prep: NONE SEEN
WBC, Wet Prep HPF POC: >=10 — AB (ref <10)
Yeast Wet Prep HPF POC: NONE SEEN

## 2023-07-22 NOTE — Discharge Instructions (Addendum)
 Thank you for allowing us  to care for you today in the MAU. Your cervix is closed and there were no regular contractions on the monitor, so your cramping is most likely [facility]. Make sure you are staying hydrated and fueling your body well, this can decrease uterine cramping!   If you are less than 36 weeks, please return to the MAU of you have:  1. Contractions: Contractions feels like menstrual cramps. You should go to the hospital if you have more than 6 strong contractions within one hour that each last 1 minute, and you cannot walk or talk during them, even after you have rested and drank at least 16 ounces of water .  2. Vaginal Bleeding: You bleeding requiring the use of a pad.  3. Leaking Fluid: sometimes it is obvious when your water  breaks causing a huge gush of fluid. However, many times it may it may be much more subtle. You should go to the hospital if you have a constant leakage of fluid from your vagina, enough to soak a pad when you are walking around.  4. Decreased Fetal Movement: if you think that you baby's movement is decreased, eat a snack and rest on your left side in a quiet room for one hour. If you have not felt the baby move more than 6 times in an hour GO TO THE HOSPITAL.

## 2023-07-22 NOTE — MAU Note (Signed)
 Holly Jacobs is a 28 y.o. at [redacted]w[redacted]d here in MAU reporting: lost some of mucus plug 3 days ago and had some more cramping. More of the mucus plug has come out and still having cramping and tightening every few minutes. Good fetal movement felt.   LMP:  Onset of complaint: 3 days Pain score: 5  Vitals:   07/22/23 1304  BP: 124/67  Pulse: 88  Resp: 18  Temp: 98 F (36.7 C)     FHT: 136  Lab orders placed from triage: u/a

## 2023-07-22 NOTE — MAU Provider Note (Signed)
 Chief Complaint:  Abdominal Pain   HPI   None     Holly Jacobs is a 28 y.o. G3P1101 at [redacted]w[redacted]d who presents to maternity admissions reporting cramping after losing her mucus plug. She reports she started having thin white odorless discharge 3 days ago and has been experiencing intermittent cramping ever since then. The discharge has increased in volume, so her OB recommended evaluation here. Cramping and tightening occurs every few minutes. Endorses good fetal movement. No concern for STIs such as gonorrhea or chlamydia.  Pregnancy Course: Receives care at [facility]. Prenatal records not available for review.  Past Medical History:  Diagnosis Date   Anxiety    Depression    GERD (gastroesophageal reflux disease)    History of kidney stones    Migraine    Renal calculus, left    OB History  Gravida Para Term Preterm AB Living  3 2 1 1  0 1  SAB IAB Ectopic Multiple Live Births  0 0 0 0 1    # Outcome Date GA Lbr Len/2nd Weight Sex Type Anes PTL Lv  3 Current           2 Term 09/13/20 [redacted]w[redacted]d    Vag-Spont     1 Preterm 05/01/19 [redacted]w[redacted]d 01:30 / 01:33 2466 g F Vag-Vacuum EPI  LIV   Past Surgical History:  Procedure Laterality Date   CYSTOSCOPY W/ URETERAL STENT REMOVAL Right 12/30/2012   Procedure: CYSTOSCOPY WITH STENT REMOVAL;  Surgeon: Reggie Caper, MD;  Location: [facility];  Service: Urology;  Laterality: Right;   CYSTOSCOPY WITH RETROGRADE PYELOGRAM, URETEROSCOPY AND STENT PLACEMENT Left 12/29/2012   Procedure: CYSTOSCOPY WITH LEFT RETROGRADE PYELOGRAM, BALLOON DILATION LEFT URETER; LEFT URETEROSCOPY ;  Surgeon: Reggie Caper, MD;  Location: [facility];  Service: Urology;  Laterality: Left;   CYSTOSCOPY WITH STENT PLACEMENT Right 12/29/2012   Procedure: CYSTOSCOPY WITH STENT PLACEMENT RIGHT URETER;  Surgeon: Reggie Caper, MD;  Location: [facility];  Service: Urology;  Laterality: Right;   CYSTOSCOPY/URETEROSCOPY/HOLMIUM LASER/STENT PLACEMENT Right 09/28/2021    Procedure: CYSTOSCOPY BILATERAL/URETEROSCOPY/HOLMIUM LASER/STENT PLACEMENT;  Surgeon: Samson Croak, MD;  Location: [facility];  Service: Urology;  Laterality: Right;  90 MINS   Family History  Problem Relation Age of Onset   Migraines Father    Skin cancer Maternal Grandmother    Diabetes Maternal Grandmother    High Cholesterol Maternal Grandmother    Social History   Tobacco Use   Smoking status: Never   Smokeless tobacco: Never  Vaping Use   Vaping status: Some Days   Substances: Nicotine, CBD  Substance Use Topics   Alcohol use: No   Drug use: No   Allergies  Allergen Reactions   Imitrex [Sumatriptan Base] Anaphylaxis, Shortness Of Breath and Itching   Sulfa Antibiotics Anaphylaxis   Latex Hives   Tamsulosin  Hcl Itching   Tramadol Hives   Other Itching and Rash    Pt says she had a previous reaction to the stent placement 11 yrs ago and her MD is aware  Pt also allergic to pre-wash done before procedure   Medications Prior to Admission  Medication Sig Dispense Refill Last Dose/Taking   aspirin  EC 81 MG tablet Take 81 mg by mouth daily. Swallow whole.   Past Week   hydrOXYzine (ATARAX) 50 MG tablet Take 50 mg by mouth 3 (three) times daily as needed for anxiety.   07/21/2023   ondansetron  (ZOFRAN -ODT) 4 MG disintegrating tablet Take 4 mg by  mouth every 8 (eight) hours as needed for nausea or vomiting.   Past Week   acetaminophen  (TYLENOL ) 500 MG tablet Take 500 mg by mouth every 6 (six) hours as needed.      docusate sodium  (COLACE) 100 MG capsule Take 100 mg by mouth 2 (two) times daily.      Prenatal Vit-Fe Fumarate-FA (PRENATAL MULTIVITAMIN) TABS tablet Take 1 tablet by mouth daily at 12 noon.       I have reviewed patient's Past Medical Hx, Surgical Hx, Family Hx, Social Hx, medications and allergies.   ROS  Pertinent items noted in HPI and remainder of comprehensive ROS otherwise negative.   PHYSICAL EXAM  Patient Vitals for the past 24 hrs:  BP Temp Pulse  Resp Height Weight  07/22/23 1316 123/71 -- 82 -- -- --  07/22/23 1304 124/67 98 F (36.7 C) 88 18 5\' 5"  (1.651 m) 80.7 kg    Constitutional: Well-developed, well-nourished female in no acute distress.  HEENT: atraumatic, normocephalic. Neck has normal ROM. EOM intact. Cardiovascular: normal rate & rhythm, warm and well-perfused Respiratory: normal effort, no problems with respiration noted. CTA bilaterally GI: Abd soft, non-tender, non-distended MSK: Extremities nontender, no edema, normal ROM Skin: warm and dry. Acyanotic, no jaundice or pallor. Neurologic: Alert and oriented x 4. No abnormal coordination. Psychiatric: Normal mood. Speech not slurred, not rapid/pressured. Patient is cooperative. GU: no CVA tenderness Pelvic exam: VULVA: normal appearing vulva with no masses, tenderness or lesions, VAGINA: normal appearing vagina with normal color and discharge, no lesions, CERVIX: cervical discharge present - thin, cream colored, and odorless, multiparous os, exam chaperoned by Pixie Brighter RN.   Fetal Tracing: Baseline FHR: 125 per minute Fetal heart variability: moderate Fetal Heart Rate accelerations: yes Fetal Heart Rate decelerations: none Fetal Non-stress Test: reactive Toco: no uterine contractions   Labs: Results for orders placed or performed during the hospital encounter of 07/22/23 (from the past 24 hours)  Urinalysis, Routine w reflex microscopic -Urine, Clean Catch     Status: Abnormal   Collection Time: 07/22/23  1:14 PM  Result Value Ref Range   Color, Urine YELLOW YELLOW   APPearance CLOUDY (A) CLEAR   Specific Gravity, Urine 1.011 1.005 - 1.030   pH 7.0 5.0 - 8.0   Glucose, UA NEGATIVE NEGATIVE mg/dL   Hgb urine dipstick NEGATIVE NEGATIVE   Bilirubin Urine NEGATIVE NEGATIVE   Ketones, ur NEGATIVE NEGATIVE mg/dL   Protein, ur NEGATIVE NEGATIVE mg/dL   Nitrite NEGATIVE NEGATIVE   Leukocytes,Ua NEGATIVE NEGATIVE  Wet prep, genital     Status: Abnormal    Collection Time: 07/22/23  1:52 PM  Result Value Ref Range   Yeast Wet Prep HPF POC NONE SEEN NONE SEEN   Trich, Wet Prep NONE SEEN NONE SEEN   Clue Cells Wet Prep HPF POC NONE SEEN NONE SEEN   WBC, Wet Prep HPF POC >=10 (A) <10   Sperm NONE SEEN     Imaging:  No results found.  MDM & MAU COURSE  MDM: Moderate  MAU Course: -Vital signs within normal limits.  -Wet prep to rule out alternative causes of white runny discharge and abdominal cramping. -Wet prep negative. -Discussed that with closed cervix and no regular uterine contractions on monitor, she is not in active labor.  Differential diagnosis considered for vaginal discharge includes but is not limited to: bacterial vaginosis, vulvovaginal candidiasis, trichomoniasis, mucus plug  Orders Placed This Encounter  Procedures   Wet prep, genital   Urinalysis,  Routine w reflex microscopic -Urine, Clean Catch   Discharge patient   No orders of the defined types were placed in this encounter.   ASSESSMENT   1. Vaginal discharge during pregnancy in second trimester   2. [redacted] weeks gestation of pregnancy     PLAN  Discharge home in stable condition with preterm labor precautions.  Maintain hydration, empty bladder, and change positions if cramping is bothersome. Return to MAU for worsening contractions/abdominal pain, vaginal bleeding, decreased fetal movement.    Follow-up Information     [facility] Follow up.   Why: As scheduled for ongoing prenatal care Contact information: [address]  [address] [place] [address] [phone]                  Allergies as of 07/22/2023       Reactions   Imitrex [sumatriptan Base] Anaphylaxis, Shortness Of Breath, Itching   Sulfa Antibiotics Anaphylaxis   Latex Hives   Tamsulosin  Hcl Itching   Tramadol Hives   Other Itching, Rash   Pt says she had a previous reaction to the stent placement 11 yrs ago and her MD is aware Pt also allergic to  pre-wash done before procedure        Medication List     TAKE these medications    acetaminophen  500 MG tablet Commonly known as: TYLENOL  Take 500 mg by mouth every 6 (six) hours as needed.   aspirin  EC 81 MG tablet Take 81 mg by mouth daily. Swallow whole.   docusate sodium  100 MG capsule Commonly known as: COLACE Take 100 mg by mouth 2 (two) times daily.   hydrOXYzine 50 MG tablet Commonly known as: ATARAX Take 50 mg by mouth 3 (three) times daily as needed for anxiety.   ondansetron  4 MG disintegrating tablet Commonly known as: ZOFRAN -ODT Take 4 mg by mouth every 8 (eight) hours as needed for nausea or vomiting.   prenatal multivitamin Tabs tablet Take 1 tablet by mouth daily at 12 noon.        Noreene Bearded, PA

## 2023-10-05 DIAGNOSIS — O3660X0 Maternal care for excessive fetal growth, unspecified trimester, not applicable or unspecified: Secondary | ICD-10-CM | POA: Insufficient documentation

## 2023-10-08 ENCOUNTER — Inpatient Hospital Stay (EMERGENCY_DEPARTMENT_HOSPITAL)
Admission: AD | Admit: 2023-10-08 | Discharge: 2023-10-08 | Disposition: A | Discharge status: Home / Self Care | Source: Home / Self Care | Attending: Obstetrics and Gynecology | Admitting: Obstetrics and Gynecology

## 2023-10-08 ENCOUNTER — Encounter (HOSPITAL_COMMUNITY): Payer: Self-pay | Admitting: Obstetrics and Gynecology

## 2023-10-08 ENCOUNTER — Other Ambulatory Visit: Payer: Self-pay

## 2023-10-08 DIAGNOSIS — O26893 Other specified pregnancy related conditions, third trimester: Secondary | ICD-10-CM | POA: Insufficient documentation

## 2023-10-08 DIAGNOSIS — N898 Other specified noninflammatory disorders of vagina: Secondary | ICD-10-CM | POA: Insufficient documentation

## 2023-10-08 DIAGNOSIS — O23593 Infection of other part of genital tract in pregnancy, third trimester: Secondary | ICD-10-CM

## 2023-10-08 DIAGNOSIS — R030 Elevated blood-pressure reading, without diagnosis of hypertension: Secondary | ICD-10-CM | POA: Insufficient documentation

## 2023-10-08 DIAGNOSIS — Z3A37 37 weeks gestation of pregnancy: Secondary | ICD-10-CM

## 2023-10-08 DIAGNOSIS — B9689 Other specified bacterial agents as the cause of diseases classified elsewhere: Secondary | ICD-10-CM | POA: Insufficient documentation

## 2023-10-08 DIAGNOSIS — D649 Anemia, unspecified: Secondary | ICD-10-CM | POA: Insufficient documentation

## 2023-10-08 DIAGNOSIS — O479 False labor, unspecified: Secondary | ICD-10-CM

## 2023-10-08 DIAGNOSIS — O471 False labor at or after 37 completed weeks of gestation: Secondary | ICD-10-CM | POA: Insufficient documentation

## 2023-10-08 DIAGNOSIS — Z8759 Personal history of other complications of pregnancy, childbirth and the puerperium: Secondary | ICD-10-CM | POA: Insufficient documentation

## 2023-10-08 DIAGNOSIS — O99013 Anemia complicating pregnancy, third trimester: Secondary | ICD-10-CM | POA: Insufficient documentation

## 2023-10-08 LAB — WET PREP, GENITAL
Sperm: NONE SEEN
Trich, Wet Prep: NONE SEEN
WBC, Wet Prep HPF POC: >=10 — AB (ref <10)
Yeast Wet Prep HPF POC: NONE SEEN

## 2023-10-08 LAB — CBC
HCT: 33.9 % — ABNORMAL LOW (ref 36.0–46.0)
Hemoglobin: 11.2 g/dL — ABNORMAL LOW (ref 12.0–15.0)
MCH: 28.4 pg (ref 26.0–34.0)
MCHC: 33 g/dL (ref 30.0–36.0)
MCV: 85.8 fL (ref 80.0–100.0)
Platelets: 299 K/uL (ref 150–400)
RBC: 3.95 MIL/uL (ref 3.87–5.11)
RDW: 12.5 % (ref 11.5–15.5)
WBC: 11.2 K/uL — ABNORMAL HIGH (ref 4.0–10.5)
nRBC: 0 % (ref 0.0–0.2)

## 2023-10-08 LAB — POCT FERN TEST
POCT Fern Test: NEGATIVE
POCT Fern Test: NEGATIVE

## 2023-10-08 LAB — PROTEIN / CREATININE RATIO, URINE
Creatinine, Urine: 56 mg/dL
Protein Creatinine Ratio: 0.14 mg/mg{creat} (ref 0.00–0.15)
Total Protein, Urine: 8 mg/dL

## 2023-10-08 LAB — COMPREHENSIVE METABOLIC PANEL WITH GFR
ALT: 14 U/L (ref 0–44)
AST: 22 U/L (ref 15–41)
Albumin: 2.7 g/dL — ABNORMAL LOW (ref 3.5–5.0)
Alkaline Phosphatase: 104 U/L (ref 38–126)
Anion gap: 11 (ref 5–15)
BUN: 8 mg/dL (ref 6–20)
CO2: 20 mmol/L — ABNORMAL LOW (ref 22–32)
Calcium: 8.8 mg/dL — ABNORMAL LOW (ref 8.9–10.3)
Chloride: 105 mmol/L (ref 98–111)
Creatinine, Ser: 0.7 mg/dL (ref 0.44–1.00)
GFR, Estimated: >60 mL/min (ref >60)
Glucose, Bld: 122 mg/dL — ABNORMAL HIGH (ref 70–99)
Potassium: 3.8 mmol/L (ref 3.5–5.1)
Sodium: 136 mmol/L (ref 135–145)
Total Bilirubin: <0.2 mg/dL (ref 0.0–1.2)
Total Protein: 6 g/dL — ABNORMAL LOW (ref 6.5–8.1)

## 2023-10-08 MED ORDER — METRONIDAZOLE 500 MG PO TABS: 500.0000 mg | ORAL_TABLET | Freq: Two times a day (BID) | ORAL | 0 refills | Status: DC

## 2023-10-08 NOTE — MAU Note (Signed)
 Holly Jacobs is a 28 y.o. at [redacted]w[redacted]d here in MAU reporting: ctxs that started 2 hours ago that feel close together. Pt is not timing them. Thinks her water  may have broken she had a gush and then a continual trickle afterwards that is clear in color. Increased pressure in lower back and rectum. Denies any VB and reports +FM.    Onset of complaint: 2 hours ago  Vitals:   10/08/23 1739  BP: (!) 143/73  Pulse: (!) 106  Resp: 16  Temp: 98.3 F (36.8 C)  SpO2: 95%     FHT:144 Lab orders placed from triage:  fern, labor eval

## 2023-10-08 NOTE — MAU Provider Note (Addendum)
 Chief Complaint:  Contractions and Rupture of Membranes   HPI   None     Holly Jacobs is a 28 y.o. G3P1101 at [redacted]w[redacted]d who presents to maternity admissions reporting contractions and possible ROM. She endorses contractions that feel close together, she has not been timing them. She would estimate they are approximately every 10 minutes. She endorses feeling a gush of fluid earlier, then a trickle. No fluid since that time. She also endorses increased lower back and rectal pressure. Denies vaginal bleeding. Endorses good fetal movement. She also notes a headache.  Pregnancy Course: Receives care at [facility]. Prenatal records reviewed.   Past Medical History:  Diagnosis Date   Anxiety    Depression    GERD (gastroesophageal reflux disease)    History of kidney stones    Migraine    Renal calculus, left    OB History  Gravida Para Term Preterm AB Living  3 2 1 1  0 1  SAB IAB Ectopic Multiple Live Births  0 0 0 0 1    # Outcome Date GA Lbr Len/2nd Weight Sex Type Anes PTL Lv  3 Current           2 Term 09/13/20 [redacted]w[redacted]d    Vag-Spont     1 Preterm 05/01/19 [redacted]w[redacted]d 01:30 / 01:33 2466 g F Vag-Vacuum EPI  LIV   Past Surgical History:  Procedure Laterality Date   CYSTOSCOPY W/ URETERAL STENT REMOVAL Right 12/30/2012   Procedure: CYSTOSCOPY WITH STENT REMOVAL;  Surgeon: Emery LILLETTE Blaze, MD;  Location: [facility];  Service: Urology;  Laterality: Right;   CYSTOSCOPY WITH RETROGRADE PYELOGRAM, URETEROSCOPY AND STENT PLACEMENT Left 12/29/2012   Procedure: CYSTOSCOPY WITH LEFT RETROGRADE PYELOGRAM, BALLOON DILATION LEFT URETER; LEFT URETEROSCOPY ;  Surgeon: Emery LILLETTE Blaze, MD;  Location: [facility];  Service: Urology;  Laterality: Left;   CYSTOSCOPY WITH STENT PLACEMENT Right 12/29/2012   Procedure: CYSTOSCOPY WITH STENT PLACEMENT RIGHT URETER;  Surgeon: Emery LILLETTE Blaze, MD;  Location: [facility];  Service: Urology;  Laterality: Right;   CYSTOSCOPY/URETEROSCOPY/HOLMIUM LASER/STENT  PLACEMENT Right 09/28/2021   Procedure: CYSTOSCOPY BILATERAL/URETEROSCOPY/HOLMIUM LASER/STENT PLACEMENT;  Surgeon: Carolee Sherwood JONETTA DOUGLAS, MD;  Location: [facility];  Service: Urology;  Laterality: Right;  90 MINS   Family History  Problem Relation Age of Onset   Migraines Father    Skin cancer Maternal Grandmother    Diabetes Maternal Grandmother    High Cholesterol Maternal Grandmother    Social History   Tobacco Use   Smoking status: Never   Smokeless tobacco: Never  Vaping Use   Vaping status: Some Days   Substances: Nicotine, CBD  Substance Use Topics   Alcohol use: No   Drug use: No   Allergies  Allergen Reactions   Imitrex [Sumatriptan Base] Anaphylaxis, Shortness Of Breath and Itching   Sulfa Antibiotics Anaphylaxis   Latex Hives   Nitrofurantoin Nausea Only   Tamsulosin  Hcl Itching   Tramadol Hives   Other Itching and Rash    Pt says she had a previous reaction to the stent placement 11 yrs ago and her MD is aware  Pt also allergic to pre-wash done before procedure   Medications Prior to Admission  Medication Sig Dispense Refill Last Dose/Taking   acetaminophen  (TYLENOL ) 500 MG tablet Take 500 mg by mouth every 6 (six) hours as needed.   Past Week   aspirin  EC 81 MG tablet Take 81 mg by mouth daily. Swallow whole.   10/07/2023   docusate sodium  (  COLACE) 100 MG capsule Take 100 mg by mouth 2 (two) times daily.   10/07/2023   hydrOXYzine (ATARAX) 50 MG tablet Take 50 mg by mouth 3 (three) times daily as needed for anxiety.   10/07/2023   ondansetron  (ZOFRAN -ODT) 4 MG disintegrating tablet Take 4 mg by mouth every 8 (eight) hours as needed for nausea or vomiting.   10/07/2023   Prenatal Vit-Fe Fumarate-FA (PRENATAL MULTIVITAMIN) TABS tablet Take 1 tablet by mouth daily at 12 noon.   10/07/2023    I have reviewed patient's Past Medical Hx, Surgical Hx, Family Hx, Social Hx, medications and allergies.   ROS  Pertinent items noted in HPI and remainder of comprehensive ROS  otherwise negative.   PHYSICAL EXAM  Patient Vitals for the past 24 hrs:  BP Temp Temp src Pulse Resp SpO2 Height Weight  10/08/23 2130 123/75 -- -- 79 -- -- -- --  10/08/23 2115 112/72 -- -- 86 -- -- -- --  10/08/23 2100 118/73 -- -- 82 -- -- -- --  10/08/23 2045 120/72 -- -- 87 -- -- -- --  10/08/23 2030 120/67 -- -- 83 -- -- -- --  10/08/23 2015 119/71 -- -- 80 -- -- -- --  10/08/23 2000 122/72 -- -- 91 -- 98 % -- --  10/08/23 1955 -- -- -- -- -- 97 % -- --  10/08/23 1950 -- -- -- -- -- 97 % -- --  10/08/23 1945 129/66 -- -- 80 -- 98 % -- --  10/08/23 1940 -- -- -- -- -- 97 % -- --  10/08/23 1935 -- -- -- -- -- 98 % -- --  10/08/23 1930 126/75 -- -- 92 -- 96 % -- --  10/08/23 1925 -- -- -- -- -- 97 % -- --  10/08/23 1920 -- -- -- -- -- 97 % -- --  10/08/23 1915 120/67 -- -- 88 -- 97 % -- --  10/08/23 1910 -- -- -- -- -- 96 % -- --  10/08/23 1905 -- -- -- -- -- 97 % -- --  10/08/23 1900 126/73 -- -- 92 -- 96 % -- --  10/08/23 1845 116/70 -- -- 95 -- 95 % -- --  10/08/23 1830 110/71 -- -- 96 -- 96 % -- --  10/08/23 1800 122/82 -- -- (!) 105 -- 97 % -- --  10/08/23 1751 127/71 -- -- (!) 101 -- -- -- --  10/08/23 1739 (!) 143/73 98.3 F (36.8 C) Oral (!) 106 16 95 % 5' 5 (1.651 m) 90.4 kg    Constitutional: Well-developed, well-nourished female in no acute distress.  HEENT: atraumatic, normocephalic. Neck has normal ROM. EOM intact. Cardiovascular: normal rate & rhythm, warm and well-perfused Respiratory: normal effort, no problems with respiration noted GI: Abd soft, non-tender, non-distended MSK: Extremities nontender, no edema, normal ROM Skin: warm and dry. Acyanotic, no jaundice or pallor. Neurologic: Alert and oriented x 4. No abnormal coordination. Psychiatric: Normal mood. Speech not slurred, not rapid/pressured. Patient is cooperative. GU: no CVA tenderness Pelvic exam: VULVA: normal appearing vulva with no masses, tenderness or lesions, VAGINA: normal appearing  vagina with normal color and discharge, no lesions, CERVIX: cervical discharge present - white, no pooling, cervix visually closed, no fluid from cervical os after patient coughed, exam chaperoned by Levon Budd RN.     Fetal Tracing: Baseline FHR: 135 per minute Fetal heart variability: moderate Fetal Heart Rate accelerations: yes Fetal Heart Rate decelerations: none Fetal Non-stress Test: Category I (reactive) Toco: uterine  irritability, no clear uterine contractions on toco  Labs: Results for orders placed or performed during the hospital encounter of 10/08/23 (from the past 24 hours)  CBC     Status: Abnormal   Collection Time: 10/08/23  6:06 PM  Result Value Ref Range   WBC 11.2 (H) 4.0 - 10.5 K/uL   RBC 3.95 3.87 - 5.11 MIL/uL   Hemoglobin 11.2 (L) 12.0 - 15.0 g/dL   HCT 66.0 (L) 63.9 - 53.9 %   MCV 85.8 80.0 - 100.0 fL   MCH 28.4 26.0 - 34.0 pg   MCHC 33.0 30.0 - 36.0 g/dL   RDW 87.4 88.4 - 84.4 %   Platelets 299 150 - 400 K/uL   nRBC 0.0 0.0 - 0.2 %  Comprehensive metabolic panel     Status: Abnormal   Collection Time: 10/08/23  6:06 PM  Result Value Ref Range   Sodium 136 135 - 145 mmol/L   Potassium 3.8 3.5 - 5.1 mmol/L   Chloride 105 98 - 111 mmol/L   CO2 20 (L) 22 - 32 mmol/L   Glucose, Bld 122 (H) 70 - 99 mg/dL   BUN 8 6 - 20 mg/dL   Creatinine, Ser 9.29 0.44 - 1.00 mg/dL   Calcium  8.8 (L) 8.9 - 10.3 mg/dL   Total Protein 6.0 (L) 6.5 - 8.1 g/dL   Albumin 2.7 (L) 3.5 - 5.0 g/dL   AST 22 15 - 41 U/L   ALT 14 0 - 44 U/L   Alkaline Phosphatase 104 38 - 126 U/L   Total Bilirubin <0.2 0.0 - 1.2 mg/dL   GFR, Estimated >39 >39 mL/min   Anion gap 11 5 - 15  Protein / creatinine ratio, urine     Status: None   Collection Time: 10/08/23  6:20 PM  Result Value Ref Range   Creatinine, Urine 56 mg/dL   Total Protein, Urine 8 mg/dL   Protein Creatinine Ratio 0.14 0.00 - 0.15 mg/mg[Cre]  Wet prep, genital     Status: Abnormal   Collection Time: 10/08/23  6:21 PM   Result Value Ref Range   Yeast Wet Prep HPF POC NONE SEEN NONE SEEN   Trich, Wet Prep NONE SEEN NONE SEEN   Clue Cells Wet Prep HPF POC PRESENT (A) NONE SEEN   WBC, Wet Prep HPF POC >=10 (A) <10   Sperm NONE SEEN   POCT fern test     Status: None   Collection Time: 10/08/23  6:56 PM  Result Value Ref Range   POCT Fern Test Negative = intact amniotic membranes   Fern Test     Status: Normal   Collection Time: 10/08/23  9:26 PM  Result Value Ref Range   POCT Fern Test Negative = intact amniotic membranes     Imaging:  No results found.  MDM & MAU COURSE  MDM: High  MAU Course: -BP elevated >140/90 on arrival to MAU. No previous elevated BP this pregnancy. -CMP, CBC, urine protein/creatinine ratio to rule out preeclampsia.  -Fern negative, no pooling on exam, no fluid from cervical os on cough. No ROM. -Wet prep positive for BV, treat with metronidazole . -CBC with mild anemia Hgb 11.2. -CMP within normal limits for pregnancy. -urine protein/creatinine ratio within normal limits. -All subsequent BP readings have been within normal limits. -Discussed with Dr. Nicholaus. Due to history of preeclampsia, will monitor until 4 hours after initial BP reading to completely rule out preeclampsia and gHTN. Otherwise discharge home with strict precautions  and close office follow up. -Care handed over to Olam Dalton NP at 2110. - @ 2141 all remaining Blood pressures have been normotensive.  Plan for discharge with precautions  Orders Placed This Encounter  Procedures   Wet prep, genital   CBC   Comprehensive metabolic panel   Protein / creatinine ratio, urine   Contraction - monitoring   External fetal heart monitoring   Vaginal exam   POCT fern test   Fern Test   Meds ordered this encounter  Medications   metroNIDAZOLE  (FLAGYL ) 500 MG tablet    Sig: Take 1 tablet (500 mg total) by mouth 2 (two) times daily for 7 days.    Dispense:  14 tablet    Refill:  0    ASSESSMENT   1.  Uterine contractions at greater than 20 weeks of gestation   2. Vaginal discharge during pregnancy in third trimester   3. Elevated blood pressure reading without diagnosis of hypertension   4. Bacterial vaginosis in pregnancy   5. [redacted] weeks gestation of pregnancy     PLAN  Discharge home in stable condition with return precautions.   Follow-up Information     [facility] Follow up.   Contact information: [address]  [address] [place] [address] [phone]                  Allergies as of 10/08/2023       Reactions   Imitrex [sumatriptan Base] Anaphylaxis, Shortness Of Breath, Itching   Sulfa Antibiotics Anaphylaxis   Latex Hives   Nitrofurantoin Nausea Only   Tamsulosin  Hcl Itching   Tramadol Hives   Other Itching, Rash   Pt says she had a previous reaction to the stent placement 11 yrs ago and her MD is aware Pt also allergic to pre-wash done before procedure        Medication List     TAKE these medications    acetaminophen  500 MG tablet Commonly known as: TYLENOL  Take 500 mg by mouth every 6 (six) hours as needed.   aspirin  EC 81 MG tablet Take 81 mg by mouth daily. Swallow whole.   docusate sodium  100 MG capsule Commonly known as: COLACE Take 100 mg by mouth 2 (two) times daily.   hydrOXYzine 50 MG tablet Commonly known as: ATARAX Take 50 mg by mouth 3 (three) times daily as needed for anxiety.   metroNIDAZOLE  500 MG tablet Commonly known as: FLAGYL  Take 1 tablet (500 mg total) by mouth 2 (two) times daily for 7 days.   ondansetron  4 MG disintegrating tablet Commonly known as: ZOFRAN -ODT Take 4 mg by mouth every 8 (eight) hours as needed for nausea or vomiting.   prenatal multivitamin Tabs tablet Take 1 tablet by mouth daily at 12 noon.        Olam DELENA Dalton, NP

## 2023-10-09 ENCOUNTER — Inpatient Hospital Stay (HOSPITAL_COMMUNITY): Admitting: Anesthesiology

## 2023-10-09 ENCOUNTER — Inpatient Hospital Stay (HOSPITAL_COMMUNITY)
Admission: AD | Admit: 2023-10-09 | Discharge: 2023-10-11 | DRG: 807 | Disposition: A | Discharge status: Home / Self Care | Attending: Obstetrics and Gynecology | Admitting: Obstetrics and Gynecology

## 2023-10-09 ENCOUNTER — Encounter (HOSPITAL_COMMUNITY): Payer: Self-pay | Admitting: Obstetrics and Gynecology

## 2023-10-09 ENCOUNTER — Other Ambulatory Visit: Payer: Self-pay

## 2023-10-09 DIAGNOSIS — R030 Elevated blood-pressure reading, without diagnosis of hypertension: Secondary | ICD-10-CM | POA: Diagnosis present

## 2023-10-09 DIAGNOSIS — K219 Gastro-esophageal reflux disease without esophagitis: Secondary | ICD-10-CM | POA: Diagnosis present

## 2023-10-09 DIAGNOSIS — Z882 Allergy status to sulfonamides status: Secondary | ICD-10-CM | POA: Diagnosis not present

## 2023-10-09 DIAGNOSIS — Z833 Family history of diabetes mellitus: Secondary | ICD-10-CM | POA: Diagnosis not present

## 2023-10-09 DIAGNOSIS — O133 Gestational [pregnancy-induced] hypertension without significant proteinuria, third trimester: Principal | ICD-10-CM

## 2023-10-09 DIAGNOSIS — O3663X0 Maternal care for excessive fetal growth, third trimester, not applicable or unspecified: Secondary | ICD-10-CM | POA: Diagnosis present

## 2023-10-09 DIAGNOSIS — O26893 Other specified pregnancy related conditions, third trimester: Secondary | ICD-10-CM | POA: Diagnosis present

## 2023-10-09 DIAGNOSIS — Z885 Allergy status to narcotic agent status: Secondary | ICD-10-CM

## 2023-10-09 DIAGNOSIS — Z91040 Latex allergy status: Secondary | ICD-10-CM

## 2023-10-09 DIAGNOSIS — Z3A37 37 weeks gestation of pregnancy: Secondary | ICD-10-CM | POA: Diagnosis not present

## 2023-10-09 DIAGNOSIS — O479 False labor, unspecified: Secondary | ICD-10-CM | POA: Diagnosis not present

## 2023-10-09 DIAGNOSIS — O9902 Anemia complicating childbirth: Secondary | ICD-10-CM | POA: Diagnosis present

## 2023-10-09 DIAGNOSIS — Z6791 Unspecified blood type, Rh negative: Secondary | ICD-10-CM | POA: Diagnosis not present

## 2023-10-09 DIAGNOSIS — O134 Gestational [pregnancy-induced] hypertension without significant proteinuria, complicating childbirth: Secondary | ICD-10-CM | POA: Diagnosis present

## 2023-10-09 DIAGNOSIS — O23593 Infection of other part of genital tract in pregnancy, third trimester: Secondary | ICD-10-CM | POA: Diagnosis not present

## 2023-10-09 DIAGNOSIS — O9962 Diseases of the digestive system complicating childbirth: Secondary | ICD-10-CM | POA: Diagnosis present

## 2023-10-09 LAB — CBC
HCT: 33.9 % — ABNORMAL LOW (ref 36.0–46.0)
HCT: 34.4 % — ABNORMAL LOW (ref 36.0–46.0)
Hemoglobin: 10.9 g/dL — ABNORMAL LOW (ref 12.0–15.0)
Hemoglobin: 11.3 g/dL — ABNORMAL LOW (ref 12.0–15.0)
MCH: 27.8 pg (ref 26.0–34.0)
MCH: 28.6 pg (ref 26.0–34.0)
MCHC: 32.2 g/dL (ref 30.0–36.0)
MCHC: 32.8 g/dL (ref 30.0–36.0)
MCV: 86.5 fL (ref 80.0–100.0)
MCV: 87.1 fL (ref 80.0–100.0)
Platelets: 270 K/uL (ref 150–400)
Platelets: 277 K/uL (ref 150–400)
RBC: 3.92 MIL/uL (ref 3.87–5.11)
RBC: 3.95 MIL/uL (ref 3.87–5.11)
RDW: 12.6 % (ref 11.5–15.5)
RDW: 12.6 % (ref 11.5–15.5)
WBC: 10.4 K/uL (ref 4.0–10.5)
WBC: 12 K/uL — ABNORMAL HIGH (ref 4.0–10.5)
nRBC: 0 % (ref 0.0–0.2)
nRBC: 0 % (ref 0.0–0.2)

## 2023-10-09 LAB — COMPREHENSIVE METABOLIC PANEL WITH GFR
ALT: 12 U/L (ref 0–44)
AST: 18 U/L (ref 15–41)
Albumin: 2.7 g/dL — ABNORMAL LOW (ref 3.5–5.0)
Alkaline Phosphatase: 106 U/L (ref 38–126)
Anion gap: 8 (ref 5–15)
BUN: 7 mg/dL (ref 6–20)
CO2: 22 mmol/L (ref 22–32)
Calcium: 8.7 mg/dL — ABNORMAL LOW (ref 8.9–10.3)
Chloride: 106 mmol/L (ref 98–111)
Creatinine, Ser: 0.71 mg/dL (ref 0.44–1.00)
GFR, Estimated: >60 mL/min (ref >60)
Glucose, Bld: 142 mg/dL — ABNORMAL HIGH (ref 70–99)
Potassium: 3.6 mmol/L (ref 3.5–5.1)
Sodium: 136 mmol/L (ref 135–145)
Total Bilirubin: 0.4 mg/dL (ref 0.0–1.2)
Total Protein: 5.9 g/dL — ABNORMAL LOW (ref 6.5–8.1)

## 2023-10-09 LAB — PROTEIN / CREATININE RATIO, URINE
Creatinine, Urine: 73 mg/dL
Protein Creatinine Ratio: 0.14 mg/mg{creat} (ref 0.00–0.15)
Total Protein, Urine: 10 mg/dL

## 2023-10-09 LAB — URINALYSIS, ROUTINE W REFLEX MICROSCOPIC
Bilirubin Urine: NEGATIVE
Glucose, UA: NEGATIVE mg/dL
Hgb urine dipstick: NEGATIVE
Ketones, ur: NEGATIVE mg/dL
Nitrite: NEGATIVE
Protein, ur: NEGATIVE mg/dL
Specific Gravity, Urine: 1.01 (ref 1.005–1.030)
pH: 7 (ref 5.0–8.0)

## 2023-10-09 LAB — TYPE AND SCREEN
ABO/RH(D): A NEG
Antibody Screen: POSITIVE

## 2023-10-09 MED ORDER — LIDOCAINE HCL (PF) 1 % IJ SOLN: 30.0000 mL | INTRAMUSCULAR | Status: DC | PRN

## 2023-10-09 MED ORDER — DIPHENHYDRAMINE HCL 50 MG/ML IJ SOLN: 12.5000 mg | INTRAMUSCULAR | Status: DC | PRN

## 2023-10-09 MED ORDER — ACETAMINOPHEN 325 MG PO TABS: 650.0000 mg | ORAL_TABLET | ORAL | Status: DC | PRN

## 2023-10-09 MED ORDER — CYCLOBENZAPRINE HCL 5 MG PO TABS
10.0000 mg | ORAL_TABLET | Freq: Once | ORAL | Status: AC
  Administered 2023-10-09: 10 mg via ORAL
  Filled 2023-10-09: qty 2

## 2023-10-09 MED ORDER — OXYTOCIN BOLUS FROM INFUSION
333.0000 mL | Freq: Once | INTRAVENOUS | Status: AC
  Administered 2023-10-10: 333 mL via INTRAVENOUS

## 2023-10-09 MED ORDER — EPHEDRINE 5 MG/ML INJ: 10.0000 mg | INTRAVENOUS | Status: DC | PRN

## 2023-10-09 MED ORDER — ONDANSETRON HCL 4 MG/2ML IJ SOLN
4.0000 mg | Freq: Four times a day (QID) | INTRAMUSCULAR | Status: DC | PRN
  Administered 2023-10-09: 4 mg via INTRAVENOUS
  Filled 2023-10-09: qty 2

## 2023-10-09 MED ORDER — LIDOCAINE HCL (PF) 1 % IJ SOLN
INTRAMUSCULAR | Status: DC | PRN
  Administered 2023-10-09 (×2): 4 mL via EPIDURAL

## 2023-10-09 MED ORDER — TERBUTALINE SULFATE 1 MG/ML IJ SOLN
0.2500 mg | Freq: Once | INTRAMUSCULAR | Status: DC | PRN
  Filled 2023-10-09: qty 1

## 2023-10-09 MED ORDER — FENTANYL-BUPIVACAINE-NACL 0.5-0.125-0.9 MG/250ML-% EP SOLN
12.0000 mL/h | EPIDURAL | Status: DC | PRN
  Administered 2023-10-09: 12 mL/h via EPIDURAL
  Filled 2023-10-09: qty 250

## 2023-10-09 MED ORDER — OXYTOCIN-SODIUM CHLORIDE 30-0.9 UT/500ML-% IV SOLN
1.0000 m[IU]/min | INTRAVENOUS | Status: DC
  Administered 2023-10-09: 2 m[IU]/min via INTRAVENOUS
  Filled 2023-10-09: qty 500

## 2023-10-09 MED ORDER — LACTATED RINGERS IV SOLN: 500.0000 mL | INTRAVENOUS | Status: DC | PRN

## 2023-10-09 MED ORDER — OXYCODONE-ACETAMINOPHEN 5-325 MG PO TABS: 2.0000 | ORAL_TABLET | ORAL | Status: DC | PRN

## 2023-10-09 MED ORDER — LACTATED RINGERS IV SOLN
500.0000 mL | Freq: Once | INTRAVENOUS | Status: AC
  Administered 2023-10-09: 500 mL via INTRAVENOUS

## 2023-10-09 MED ORDER — LACTATED RINGERS IV SOLN
INTRAVENOUS | Status: DC
Start: 2023-10-09 — End: 2023-10-10

## 2023-10-09 MED ORDER — SOD CITRATE-CITRIC ACID 500-334 MG/5ML PO SOLN: 30.0000 mL | ORAL | Status: DC | PRN

## 2023-10-09 MED ORDER — FENTANYL CITRATE (PF) 100 MCG/2ML IJ SOLN: 50.0000 ug | INTRAMUSCULAR | Status: DC | PRN

## 2023-10-09 MED ORDER — OXYTOCIN-SODIUM CHLORIDE 30-0.9 UT/500ML-% IV SOLN
2.5000 [IU]/h | INTRAVENOUS | Status: DC
  Administered 2023-10-10: 2.5 [IU]/h via INTRAVENOUS

## 2023-10-09 MED ORDER — PHENYLEPHRINE 80 MCG/ML (10ML) SYRINGE FOR IV PUSH (FOR BLOOD PRESSURE SUPPORT): 80.0000 ug | PREFILLED_SYRINGE | INTRAVENOUS | Status: DC | PRN

## 2023-10-09 MED ORDER — OXYCODONE-ACETAMINOPHEN 5-325 MG PO TABS: 1.0000 | ORAL_TABLET | ORAL | Status: DC | PRN

## 2023-10-09 MED ORDER — PHENYLEPHRINE 80 MCG/ML (10ML) SYRINGE FOR IV PUSH (FOR BLOOD PRESSURE SUPPORT)
80.0000 ug | PREFILLED_SYRINGE | INTRAVENOUS | Status: DC | PRN
  Filled 2023-10-09 (×2): qty 10

## 2023-10-09 NOTE — MAU Note (Signed)
 Holly Jacobs is a 28 y.o. at [redacted]w[redacted]d here in MAU reporting: headache since yesterday around 1300 as well as elevated BP at home (134/87, 130/92). Took Tylenol  650 mg at 1330 today. Headache now 8/10. Pt. Reports history of PIH with prior pregnancy. Endorses blurred vision with onset of headache. Patient denies sudden swelling of hands, feet or face. Baby is moving well. Denies regular, painful contractions, vaginal bleeding or concerns that her water  has broken. No other complaints today.   Onset of complaint: 10/08/23 Pain score: 8/10 Vitals:   10/09/23 1506  BP: 133/87  Pulse: 100  Resp: 18  Temp: 98.1 F (36.7 C)  SpO2: 100%     FHT: 138  Lab orders placed from triage: UA

## 2023-10-09 NOTE — Anesthesia Preprocedure Evaluation (Signed)
 Anesthesia Evaluation  Patient identified by MRN, date of birth, ID band Patient awake    Reviewed: Allergy & Precautions, NPO status , Patient's Chart, lab work & pertinent test results  History of Anesthesia Complications Negative for: history of anesthetic complications  Airway Mallampati: III  TM Distance: >3 FB Neck ROM: Full    Dental   Pulmonary neg pulmonary ROS, Patient abstained from smoking.   Pulmonary exam normal breath sounds clear to auscultation       Cardiovascular hypertension (gestational),  Rhythm:Regular Rate:Normal     Neuro/Psych  Headaches PSYCHIATRIC DISORDERS Anxiety Depression       GI/Hepatic Neg liver ROS,GERD  ,,  Endo/Other  negative endocrine ROS    Renal/GU negative Renal ROS     Musculoskeletal   Abdominal   Peds  Hematology  (+) Blood dyscrasia, anemia Lab Results      Component                Value               Date                      WBC                      12.0 (H)            10/09/2023                HGB                      11.3 (L)            10/09/2023                HCT                      34.4 (L)            10/09/2023                MCV                      87.1                10/09/2023                PLT                      277                 10/09/2023              Anesthesia Other Findings   Reproductive/Obstetrics (+) Pregnancy                              Anesthesia Physical Anesthesia Plan  ASA: 2  Anesthesia Plan: Epidural   Post-op Pain Management:    Induction:   PONV Risk Score and Plan:   Airway Management Planned: Natural Airway  Additional Equipment:   Intra-op Plan:   Post-operative Plan:   Informed Consent: I have reviewed the patients History and Physical, chart, labs and discussed the procedure including the risks, benefits and alternatives for the proposed anesthesia with the patient or authorized  representative who has indicated his/her understanding and acceptance.       Plan Discussed with: Anesthesiologist  Anesthesia Plan Comments: (I have discussed risks of neuraxial anesthesia including but not limited to infection, bleeding, nerve injury, back pain, headache, seizures, and failure of block. Patient denies bleeding disorders and is not currently anticoagulated. Labs have been reviewed. Risks and benefits discussed. All patient's questions answered.  )         Anesthesia Quick Evaluation

## 2023-10-09 NOTE — Progress Notes (Signed)
 Labor Note  S: Mild HA 2/10, cramping now 6/10. Denies VB, LOF  O: BP 111/66   Pulse 80   Temp 98 F (36.7 C) (Oral)   Resp 16   Ht 5' 5 (1.651 m)   Wt 89.8 kg   SpO2 98%   BMI 32.95 kg/m  CE: 2-3/50/-3, much more anterior and soft. Clear AROM 1915 FHR: Baseline 135, +accels, -decels, mod variability TOCO q3-52m, pitocin  at 72mU/min  A/P: This is a 28 y.o. G3P1101 at [redacted]w[redacted]d  admitted for IOL for newly diagnosed GHTN. GBS neg FWB: cat 1  MWB: asx from HTN, PreE labs WNL Labor course: S/p AROM, pitocin  per protocol. Planning epidural  Anticipate SVD

## 2023-10-09 NOTE — H&P (Signed)
 Holly Jacobs is a 28 y.o. female presenting for evaluation of elevated BP at home and headache. Was seen in MAU last night for r/o ROM, neg. +FM, denies VB. Has some irregular cramping  PNC c.b 1) VAVD x2 - FHR decels, OP 2) H/o HTN disorders - PreE w/ SF in G1 (del 35 6/7), CHTN no Rx in G2 3) Palpitations - Echo 07/14/23 WNL, NSR< normal structure, no additional views needed 4) Rh neg  - given 6/19 5) LGA infant - GS 36wks EFW 3642g/8lb0z/91%tile, AFI 17.8cm, vtx  GBS neg OB History     Gravida  3   Para  2   Term  1   Preterm  1   AB  0   Living  1      SAB  0   IAB  0   Ectopic  0   Multiple  0   Live Births  2          Past Medical History:  Diagnosis Date   Anxiety    Depression    GERD (gastroesophageal reflux disease)    History of kidney stones    Migraine    Renal calculus, left    Past Surgical History:  Procedure Laterality Date   CYSTOSCOPY W/ URETERAL STENT REMOVAL Right 12/30/2012   Procedure: CYSTOSCOPY WITH STENT REMOVAL;  Surgeon: Emery LILLETTE Blaze, MD;  Location: [facility];  Service: Urology;  Laterality: Right;   CYSTOSCOPY WITH RETROGRADE PYELOGRAM, URETEROSCOPY AND STENT PLACEMENT Left 12/29/2012   Procedure: CYSTOSCOPY WITH LEFT RETROGRADE PYELOGRAM, BALLOON DILATION LEFT URETER; LEFT URETEROSCOPY ;  Surgeon: Emery LILLETTE Blaze, MD;  Location: [facility];  Service: Urology;  Laterality: Left;   CYSTOSCOPY WITH STENT PLACEMENT Right 12/29/2012   Procedure: CYSTOSCOPY WITH STENT PLACEMENT RIGHT URETER;  Surgeon: Emery LILLETTE Blaze, MD;  Location: [facility];  Service: Urology;  Laterality: Right;   CYSTOSCOPY/URETEROSCOPY/HOLMIUM LASER/STENT PLACEMENT Right 09/28/2021   Procedure: CYSTOSCOPY BILATERAL/URETEROSCOPY/HOLMIUM LASER/STENT PLACEMENT;  Surgeon: Carolee Sherwood JONETTA DOUGLAS, MD;  Location: [facility];  Service: Urology;  Laterality: Right;  90 MINS   Family History: family history includes Diabetes in her maternal grandmother; High Cholesterol in  her maternal grandmother; Migraines in her father; Skin cancer in her maternal grandmother. Social History:  reports that she has never smoked. She has never used smokeless tobacco. She reports that she does not drink alcohol and does not use drugs.     Maternal Diabetes: No1hr 111 Genetic Screening: Normal Maternal Ultrasounds/Referrals: Normal Fetal Ultrasounds or other Referrals:  None Maternal Substance Abuse:  No Significant Maternal Medications:  None Significant Maternal Lab Results:  Group B Strep negative and Rh negative Number of Prenatal Visits:greater than 3 verified prenatal visits Maternal Vaccinations:TDap Other Comments:  None  Review of Systems  Constitutional:  Negative for chills and fever.  Respiratory:  Negative for shortness of breath.   Cardiovascular:  Negative for chest pain, palpitations and leg swelling.  Gastrointestinal:  Negative for abdominal pain and vomiting.  Neurological:  Negative for dizziness, weakness and headaches.  Psychiatric/Behavioral:  Negative for suicidal ideas.    Maternal Medical History:  Reason for admission: HA  Contractions: Onset was 1-2 hours ago.   Frequency: irregular.   Fetal activity: Perceived fetal activity is normal.   Prenatal complications: PIH.   No preterm labor.   Prenatal Complications - Diabetes: none.   Dilation: 2.5 Effacement (%): 50 Station: -3 Exam by:: Dr Alveda Blood pressure 127/79, pulse 94, temperature 98 F (36.7  C), temperature source Oral, resp. rate 20, height 5' 5 (1.651 m), weight 89.8 kg, SpO2 98%, unknown if currently breastfeeding. Exam Physical Exam Constitutional:      General: She is not in acute distress.    Appearance: She is well-developed.  HENT:     Head: Normocephalic and atraumatic.  Eyes:     Pupils: Pupils are equal, round, and reactive to light.  Cardiovascular:     Rate and Rhythm: Normal rate and regular rhythm.     Heart sounds: No murmur heard.    No gallop.   Abdominal:     Tenderness: There is no abdominal tenderness. There is no guarding or rebound.  Genitourinary:    Vagina: Normal.  Musculoskeletal:        General: Normal range of motion.     Cervical back: Normal range of motion and neck supple.  Skin:    General: Skin is warm and dry.  Neurological:     Mental Status: She is alert and oriented to person, place, and time.     Prenatal labs: ABO, Rh: --/--/PENDING (08/17 1703) Antibody: PENDING (08/17 1703) Rubella:  imm RPR:   nr HBsAg:   neg HIV:   nr GBS:   neg Cat 1 tracing TOCO irritable  Assessment/Plan: This is a 28yo [id number] @ 37 3/7 by 7wk TVUS presnting with elevated BP, met criteria for GHTN. GBS neg. Mild HA on admission has resolved. Desires epidural, GBS neg. CE 2-3/50/-3. Pitocin  per protocol, AROM when better engaged  Holly Jacobs 10/09/2023, 5:47 PM

## 2023-10-09 NOTE — Progress Notes (Signed)
 Patient informed that the ultrasound is considered a limited OB ultrasound and is not intended to be a complete ultrasound exam.  Patient also informed that the ultrasound is not being completed with the intent of assessing for fetal or placental anomalies or any pelvic abnormalities.  Explained that the purpose of today's ultrasound is to assess for  presentation.  Patient acknowledges the purpose of the exam and the limitations of the study. Patient found to be vertex with US .

## 2023-10-09 NOTE — MAU Provider Note (Signed)
 MAU Provider Note  Chief Complaint: Headache  SUBJECTIVE HPI: Holly Jacobs is a 28 y.o. G3P1101 at [redacted]w[redacted]d by early ultrasound who presents to maternity admissions reporting HA, HBP. Pregnancy c/b hx of PIH x2, hx migraines, hx nephrolithiasis. Receives [facility] with [facility].  Patient notes development of HA last night. Mostly throbbing on left side of forehead with some pressure behind b/l eyes. Tried tylenol  without relief. HA worsened overnight. She also notes floaters in her vision. No RUQ pain, extremity swelling, CP, SOB. Denies VB, LOF, contractions. +FM.  HPI  Past Medical History:  Diagnosis Date   Anxiety    Depression    GERD (gastroesophageal reflux disease)    History of kidney stones    Migraine    Renal calculus, left    Past Surgical History:  Procedure Laterality Date   CYSTOSCOPY W/ URETERAL STENT REMOVAL Right 12/30/2012   Procedure: CYSTOSCOPY WITH STENT REMOVAL;  Surgeon: Emery LILLETTE Blaze, MD;  Location: [facility];  Service: Urology;  Laterality: Right;   CYSTOSCOPY WITH RETROGRADE PYELOGRAM, URETEROSCOPY AND STENT PLACEMENT Left 12/29/2012   Procedure: CYSTOSCOPY WITH LEFT RETROGRADE PYELOGRAM, BALLOON DILATION LEFT URETER; LEFT URETEROSCOPY ;  Surgeon: Emery LILLETTE Blaze, MD;  Location: [facility];  Service: Urology;  Laterality: Left;   CYSTOSCOPY WITH STENT PLACEMENT Right 12/29/2012   Procedure: CYSTOSCOPY WITH STENT PLACEMENT RIGHT URETER;  Surgeon: Emery LILLETTE Blaze, MD;  Location: [facility];  Service: Urology;  Laterality: Right;   CYSTOSCOPY/URETEROSCOPY/HOLMIUM LASER/STENT PLACEMENT Right 09/28/2021   Procedure: CYSTOSCOPY BILATERAL/URETEROSCOPY/HOLMIUM LASER/STENT PLACEMENT;  Surgeon: Carolee Sherwood JONETTA DOUGLAS, MD;  Location: [facility];  Service: Urology;  Laterality: Right;  90 MINS   Social History   Socioeconomic History   Marital status: Legally Separated    Spouse name: Not on file   Number of children: 0   Years of education: HS   Highest education  level: Not on file  Occupational History    Employer: [employer]     Comment: [employer]    Employer: OTHER    Comment: Student  Tobacco Use   Smoking status: Never   Smokeless tobacco: Never  Vaping Use   Vaping status: Some Days   Substances: Nicotine, CBD  Substance and Sexual Activity   Alcohol use: No   Drug use: No   Sexual activity: Yes    Birth control/protection: None  Other Topics Concern   Not on file  Social History Narrative   Patient lives at home with mom, grandma, step-grandpa.   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    Caffeine  Use: 1 soda daily occasionally   Social Drivers of Corporate investment banker Strain: Not on file  Food Insecurity: Not on file  Transportation Needs: Not on file  Physical Activity: Not on file  Stress: Not on file  Social Connections: Not on file  Intimate Partner Violence: Not on file   No current facility-administered medications on file prior to encounter.   Current Outpatient Medications on File Prior to Encounter  Medication Sig Dispense Refill   acetaminophen  (TYLENOL ) 500 MG tablet Take 500 mg by mouth every 6 (six) hours as needed.     aspirin  EC 81 MG tablet Take 81 mg by mouth daily. Swallow whole.     docusate sodium  (COLACE) 100 MG capsule Take 100 mg by mouth 2 (two) times daily.     hydrOXYzine (ATARAX) 50 MG tablet Take 50 mg by mouth 3 (three) times daily  as needed for anxiety.     ondansetron  (ZOFRAN -ODT) 4 MG disintegrating tablet Take 4 mg by mouth every 8 (eight) hours as needed for nausea or vomiting.     Prenatal Vit-Fe Fumarate-FA (PRENATAL MULTIVITAMIN) TABS tablet Take 1 tablet by mouth daily at 12 noon.     metroNIDAZOLE  (FLAGYL ) 500 MG tablet Take 1 tablet (500 mg total) by mouth 2 (two) times daily for 7 days. 14 tablet 0   Allergies  Allergen Reactions   Imitrex [Sumatriptan Base] Anaphylaxis, Shortness Of Breath and Itching   Sulfa Antibiotics Anaphylaxis   Latex Hives    Nitrofurantoin Nausea Only   Tamsulosin  Hcl Itching   Tramadol Hives   Other Itching and Rash    Pt says she had a previous reaction to the stent placement 11 yrs ago and her MD is aware  Pt also allergic to pre-wash done before procedure    ROS:  Pertinent positives/negatives listed above.  I have reviewed patient's Past Medical Hx, Surgical Hx, Family Hx, Social Hx, medications and allergies.   Physical Exam  Patient Vitals for the past 24 hrs:  BP Temp Temp src Pulse Resp SpO2 Height Weight  10/09/23 1737 -- -- -- -- -- -- 5' 5 (1.651 m) 89.8 kg  10/09/23 1730 127/79 98 F (36.7 C) Oral 94 20 -- -- --  10/09/23 1645 (!) 141/79 -- -- 96 -- 98 % -- --  10/09/23 1630 130/86 -- -- (!) 103 -- 98 % -- --  10/09/23 1615 (!) 140/79 -- -- (!) 111 -- 97 % -- --  10/09/23 1600 138/87 -- -- 99 -- 97 % -- --  10/09/23 1547 137/76 -- -- 94 -- -- -- --  10/09/23 1546 -- -- -- -- -- 96 % -- --  10/09/23 1530 130/80 -- -- 96 -- 96 % -- --  10/09/23 1529 119/78 -- -- 94 -- -- -- --  10/09/23 1523 -- -- -- -- -- -- 5' 5 (1.651 m) 90.2 kg  10/09/23 1506 133/87 98.1 F (36.7 C) Oral 100 18 100 % -- --   Constitutional: Well-developed, well-nourished female in no acute distress. Appears uncomfortable  Cardiovascular: normal rate Respiratory: normal effort GI: Abd soft, non-tender, gravid MS: Extremities nontender, no edema, normal ROM Neurologic: Alert and oriented x 4  FHT:  Baseline 135, moderate variability, accelerations present, no decelerations Contractions: UI  LAB RESULTS Results for orders placed or performed during the hospital encounter of 10/09/23 (from the past 24 hours)  CBC     Status: Abnormal   Collection Time: 10/09/23  3:47 PM  Result Value Ref Range   WBC 10.4 4.0 - 10.5 K/uL   RBC 3.92 3.87 - 5.11 MIL/uL   Hemoglobin 10.9 (L) 12.0 - 15.0 g/dL   HCT 66.0 (L) 63.9 - 53.9 %   MCV 86.5 80.0 - 100.0 fL   MCH 27.8 26.0 - 34.0 pg   MCHC 32.2 30.0 - 36.0 g/dL    RDW 87.3 88.4 - 84.4 %   Platelets 270 150 - 400 K/uL   nRBC 0.0 0.0 - 0.2 %  Comprehensive metabolic panel     Status: Abnormal   Collection Time: 10/09/23  3:47 PM  Result Value Ref Range   Sodium 136 135 - 145 mmol/L   Potassium 3.6 3.5 - 5.1 mmol/L   Chloride 106 98 - 111 mmol/L   CO2 22 22 - 32 mmol/L   Glucose, Bld 142 (H) 70 - 99 mg/dL  BUN 7 6 - 20 mg/dL   Creatinine, Ser 9.28 0.44 - 1.00 mg/dL   Calcium  8.7 (L) 8.9 - 10.3 mg/dL   Total Protein 5.9 (L) 6.5 - 8.1 g/dL   Albumin 2.7 (L) 3.5 - 5.0 g/dL   AST 18 15 - 41 U/L   ALT 12 0 - 44 U/L   Alkaline Phosphatase 106 38 - 126 U/L   Total Bilirubin 0.4 0.0 - 1.2 mg/dL   GFR, Estimated >39 >39 mL/min   Anion gap 8 5 - 15  Urinalysis, Routine w reflex microscopic -Urine, Clean Catch     Status: Abnormal   Collection Time: 10/09/23  3:49 PM  Result Value Ref Range   Color, Urine YELLOW YELLOW   APPearance HAZY (A) CLEAR   Specific Gravity, Urine 1.010 1.005 - 1.030   pH 7.0 5.0 - 8.0   Glucose, UA NEGATIVE NEGATIVE mg/dL   Hgb urine dipstick NEGATIVE NEGATIVE   Bilirubin Urine NEGATIVE NEGATIVE   Ketones, ur NEGATIVE NEGATIVE mg/dL   Protein, ur NEGATIVE NEGATIVE mg/dL   Nitrite NEGATIVE NEGATIVE   Leukocytes,Ua MODERATE (A) NEGATIVE   RBC / HPF 0-5 0 - 5 RBC/hpf   WBC, UA 21-50 0 - 5 WBC/hpf   Bacteria, UA RARE (A) NONE SEEN   Squamous Epithelial / HPF 11-20 0 - 5 /HPF   Hyaline Casts, UA PRESENT   Protein / creatinine ratio, urine     Status: None   Collection Time: 10/09/23  3:49 PM  Result Value Ref Range   Creatinine, Urine 73 mg/dL   Total Protein, Urine 10 mg/dL   Protein Creatinine Ratio 0.14 0.00 - 0.15 mg/mg[Cre]  Type and screen [facility]     Status: None (Preliminary result)   Collection Time: 10/09/23  5:03 PM  Result Value Ref Range   ABO/RH(D) PENDING    Antibody Screen PENDING    Sample Expiration      10/12/2023,2359 Performed at [facility], [address]  [place], [place] [address]     --/--/PENDING (08/17 1703)  IMAGING No results found.  MAU Management/MDM: Orders Placed This Encounter  Procedures   Urinalysis, Routine w reflex microscopic -Urine, Clean Catch   CBC   Comprehensive metabolic panel   Protein / creatinine ratio, urine   CBC   RPR   Cervical Exam   Insert urethral catheter X 1 PRN If Coude Catheter is chosen, qualified resources by campus can be found in the clinical skills nursing procedure for Coude Catheter 1. If straight catheterized > 2 times or patient unable to void post epidural plac...   Evaluate fetal heart rate to establish reassuring pattern prior to initiating Cytotec  or Pitocin    Perform a cervical exam prior to initiating Cytotec  or Pitocin    Discontinue Pitocin  if tachysystole with non-reassuring FHR is present   Notify physician (specify) Tachysystole is defined as more than 5 contractions in a 10-minute time period averaged over a 30-minute window   Initiate intrauterine resuscitation if tachysystole with non-reassuring FHR is present   Notify physician (specify) Tachysystole is defined as more than 5 contractions in a 10-minute time period averaged over a 30-minute window   May administer Terbutaline  0.25 mg SQ x 1 dose if tachysystole with non-reassuring FHR is present   Labor Induction   Type and screen [facility]   Insert and maintain IV Line    Meds ordered this encounter  Medications   cyclobenzaprine  (FLEXERIL ) tablet 10  mg   fentaNYL  (SUBLIMAZE ) injection 50-100 mcg   terbutaline  (BRETHINE ) injection 0.25 mg   oxytocin  (PITOCIN ) IV infusion 30 units in NS 500 mL - Premix    Begin infusion at::   2 milli-units/min (2 mL/hr)    Increase infusion by::   2 milli-units/min (2 mL/hr)     Available prenatal records reviewed.  Patient presents with new-onset HA, vision changes, and elevated BP at home. Tylenol  unhelpful for headache. Will obtain preE labs and cycle  pressures.  Patient with another mild range pressure. Now meeting criteria for gHTN with two mild range pressures 8/16 and 8/17. Labs normal. Discussed case with attending, Dr. Sudie, who will admit for IOL.  ASSESSMENT 1. Gestational hypertension, third trimester     PLAN Admit to LD for IOL.  Almarie Moats, MD OB Fellow 10/09/2023  5:42 PM

## 2023-10-09 NOTE — Anesthesia Procedure Notes (Signed)
 Epidural Patient location during procedure: OB Start time: 10/09/2023 10:48 PM End time: 10/09/2023 10:53 PM  Staffing Anesthesiologist: Peggye Delon Brunswick, MD Performed: anesthesiologist   Preanesthetic Checklist Completed: patient identified, IV checked, site marked, risks and benefits discussed, surgical consent, monitors and equipment checked, pre-op evaluation and timeout performed  Epidural Patient position: sitting Prep: DuraPrep and site prepped and draped Patient monitoring: continuous pulse ox and blood pressure Approach: midline Location: L3-L4 Injection technique: LOR saline  Needle:  Needle type: Tuohy  Needle gauge: 17 G Needle length: 9 cm and 9 Needle insertion depth: 7 cm Catheter type: closed end flexible Catheter size: 19 Gauge Catheter at skin depth: 12 cm Test dose: negative  Assessment Events: blood not aspirated, no cerebrospinal fluid, injection not painful, no injection resistance, no paresthesia and negative IV test  Additional Notes The patient has requested an epidural for labor pain management. Risks and benefits including, but not limited to, infection, bleeding, local anesthetic toxicity, headache, hypotension, back pain, block failure, etc. were discussed with the patient. The patient expressed understanding and consented to the procedure. I confirmed that the patient has no bleeding disorders and is not taking blood thinners. I confirmed the patient's last platelet count with the nurse. A time-out was performed immediately prior to the procedure. Please see nursing documentation for vital signs. Sterile technique was used throughout the whole procedure. Once LOR achieved, the epidural catheter threaded easily without resistance. Aspiration of the catheter was negative for blood and CSF. The epidural was dosed slowly and an infusion was started.  1 attempt(s)Reason for block:procedure for pain

## 2023-10-09 NOTE — Progress Notes (Signed)
 Labor Note  S: More cramping noted, continued leaking  O: BP 130/82   Pulse 70   Temp 97.8 F (36.6 C) (Oral)   Resp 17   Ht 5' 5 (1.651 m)   Wt 89.8 kg   SpO2 98%   BMI 32.95 kg/m  CE: 3-4/70/-2, more mid-position FHR: Baseline 135, +accels, -decels, mod variability TOCO q3-18m, pitocin  at 60mU/min  A/P: This is a 28 y.o. G3P1101 at [redacted]w[redacted]d  admitted for IOL for newly diagnosed GHTN. GBS neg FWB: cat 1  MWB: asx from HTN, PreE labs WNL Labor course: S/p AROM, pitocin  per protocol. Planning epidural  Anticipate SVD

## 2023-10-10 ENCOUNTER — Encounter (HOSPITAL_COMMUNITY): Payer: Self-pay | Admitting: Obstetrics and Gynecology

## 2023-10-10 LAB — BIRTH TISSUE RECOVERY COLLECTION (PLACENTA DONATION)

## 2023-10-10 LAB — RPR: RPR Ser Ql: NONREACTIVE

## 2023-10-10 LAB — CBC
HCT: 31.5 % — ABNORMAL LOW (ref 36.0–46.0)
Hemoglobin: 10.5 g/dL — ABNORMAL LOW (ref 12.0–15.0)
MCH: 28.4 pg (ref 26.0–34.0)
MCHC: 33.3 g/dL (ref 30.0–36.0)
MCV: 85.1 fL (ref 80.0–100.0)
Platelets: 254 K/uL (ref 150–400)
RBC: 3.7 MIL/uL — ABNORMAL LOW (ref 3.87–5.11)
RDW: 12.9 % (ref 11.5–15.5)
WBC: 17 K/uL — ABNORMAL HIGH (ref 4.0–10.5)
nRBC: 0 % (ref 0.0–0.2)

## 2023-10-10 MED ORDER — ACETAMINOPHEN 325 MG PO TABS
650.0000 mg | ORAL_TABLET | ORAL | Status: DC | PRN
  Administered 2023-10-10: 650 mg via ORAL
  Filled 2023-10-10: qty 2

## 2023-10-10 MED ORDER — COCONUT OIL OIL
1.0000 | TOPICAL_OIL | Status: DC | PRN
Start: 2023-10-10 — End: 2023-10-11

## 2023-10-10 MED ORDER — IBUPROFEN 600 MG PO TABS
600.0000 mg | ORAL_TABLET | Freq: Four times a day (QID) | ORAL | Status: DC
  Administered 2023-10-10 – 2023-10-11 (×5): 600 mg via ORAL
  Filled 2023-10-10 (×5): qty 1

## 2023-10-10 MED ORDER — PRENATAL MULTIVITAMIN CH
1.0000 | ORAL_TABLET | Freq: Every day | ORAL | Status: DC
  Administered 2023-10-10 – 2023-10-11 (×2): 1 via ORAL
  Filled 2023-10-10 (×2): qty 1

## 2023-10-10 MED ORDER — TRANEXAMIC ACID-NACL 1000-0.7 MG/100ML-% IV SOLN
INTRAVENOUS | Status: AC
  Administered 2023-10-10: 1000 mg
  Filled 2023-10-10: qty 100

## 2023-10-10 MED ORDER — DIPHENHYDRAMINE HCL 25 MG PO CAPS: 25.0000 mg | ORAL_CAPSULE | Freq: Four times a day (QID) | ORAL | Status: DC | PRN

## 2023-10-10 MED ORDER — BENZOCAINE-MENTHOL 20-0.5 % EX AERO: 1.0000 | INHALATION_SPRAY | CUTANEOUS | Status: DC | PRN

## 2023-10-10 MED ORDER — SENNOSIDES-DOCUSATE SODIUM 8.6-50 MG PO TABS
2.0000 | ORAL_TABLET | Freq: Every day | ORAL | Status: DC
  Administered 2023-10-11: 2 via ORAL
  Filled 2023-10-10: qty 2

## 2023-10-10 MED ORDER — ONDANSETRON HCL 4 MG PO TABS
4.0000 mg | ORAL_TABLET | ORAL | Status: DC | PRN
Start: 2023-10-10 — End: 2023-10-11
  Administered 2023-10-10: 4 mg via ORAL
  Filled 2023-10-10: qty 1

## 2023-10-10 MED ORDER — SIMETHICONE 80 MG PO CHEW
80.0000 mg | CHEWABLE_TABLET | ORAL | Status: DC | PRN
Start: 2023-10-10 — End: 2023-10-11

## 2023-10-10 MED ORDER — ZOLPIDEM TARTRATE 5 MG PO TABS: 5.0000 mg | ORAL_TABLET | Freq: Every evening | ORAL | Status: DC | PRN

## 2023-10-10 MED ORDER — ONDANSETRON HCL 4 MG/2ML IJ SOLN
4.0000 mg | INTRAMUSCULAR | Status: DC | PRN
Start: 2023-10-10 — End: 2023-10-11

## 2023-10-10 MED ORDER — DIBUCAINE (PERIANAL) 1 % EX OINT: 1.0000 | TOPICAL_OINTMENT | CUTANEOUS | Status: DC | PRN

## 2023-10-10 MED ORDER — WITCH HAZEL-GLYCERIN EX PADS: 1.0000 | MEDICATED_PAD | CUTANEOUS | Status: DC | PRN

## 2023-10-10 MED ORDER — TETANUS-DIPHTH-ACELL PERTUSSIS 5-2.5-18.5 LF-MCG/0.5 IM SUSY: 0.5000 mL | PREFILLED_SYRINGE | Freq: Once | INTRAMUSCULAR | Status: DC

## 2023-10-10 NOTE — Progress Notes (Signed)
 Labor Note  S: Called to bedside with concern for recurrent variables. Pitocin  stopped at 0332 for this reason. On my arrival, pt appears WNL but has occ urge to push and some nausea. Denies other PreE symptoms  O: BP 122/72   Pulse 61   Temp 97.8 F (36.6 C) (Oral)   Resp 17   Ht 5' 5 (1.651 m)   Wt 89.8 kg   SpO2 100%   BMI 32.95 kg/m  CE: complete/-1 to 0 station FHR: Baseline 115, -accels,  miz of mostly early and occ variable decels, mod variability TOCO q58min  A/P: This is a 28 y.o. G3P1101 at [redacted]w[redacted]d  admitted for IOL for newly diagnosed GHTN. GBS neg FWB: currently cat 1 MWB: asx from HTN, PreE labs WNL. S/p epidural Labor course: S/p AROM, pitocin  per protocol. Now complete, prior strip change likely 2/2 rapid cervical change.  Anticipate SVD

## 2023-10-10 NOTE — Lactation Note (Addendum)
 This note was copied from a baby's chart. Lactation Consultation Note  Patient Name: Holly Jacobs Unijb'd Date: 10/10/2023 Age:29 hours Reason for consult: Initial assessment;Early term 37-38.6wks  P3, Baby [redacted]w[redacted]d.  Baby sleeping after recent feeding. Mother states she had to stop breastfeeding at about 3-4 mos. with her last child due to a milk allergy.   MD suggested she change her diet but mother states she was unable to at that time.  Suggest if it happens again with this child to contact Lactation for guidance with her diet and assistance if needed.  Feed on demand with cues.  Goal 8-12+ times per day after first 24 hrs.  Place baby STS if not cueing.    Maternal Data Has patient been taught Hand Expression?: Yes Does the patient have breastfeeding experience prior to this delivery?: Yes How long did the patient breastfeed?: 11 mos with first child and 3 mos with second due to milk allergy.  Feeding Mother's Current Feeding Choice: Breast Milk  LATCH Score Latch: Repeated attempts needed to sustain latch, nipple held in mouth throughout feeding, stimulation needed to elicit sucking reflex.  Audible Swallowing: Spontaneous and intermittent  Type of Nipple: Everted at rest and after stimulation  Comfort (Breast/Nipple): Soft / non-tender  Hold (Positioning): No assistance needed to correctly position infant at breast.  LATCH Score: 9 Interventions Interventions: Breast feeding basics reviewed;Education;LC Services brochure;CDC milk storage guidelines  Discharge Pump: Personal;Hands Free;DEBP (Spectra  and Momcozy)  Consult Status Consult Status: Follow-up Date: 10/11/23 Follow-up type: In-patient   Shannon Levorn Lemme  RN, IBCLC 10/10/2023, 8:57 AM

## 2023-10-10 NOTE — Anesthesia Postprocedure Evaluation (Signed)
 Anesthesia Post Note  Patient: Holly Jacobs  Procedure(s) Performed: AN AD HOC LABOR EPIDURAL     Patient location during evaluation: Mother Baby Anesthesia Type: Epidural Level of consciousness: awake and alert Pain management: pain level controlled Vital Signs Assessment: post-procedure vital signs reviewed and stable Respiratory status: spontaneous breathing, nonlabored ventilation and respiratory function stable Cardiovascular status: stable Postop Assessment: no headache, no backache and epidural receding Anesthetic complications: no   No notable events documented.  Last Vitals:  Vitals:   10/10/23 0631 10/10/23 0818  BP: 120/70 122/78  Pulse: 64 78  Resp: 16 18  Temp:  36.6 C  SpO2: 98% 99%    Last Pain:  Vitals:   10/10/23 0633  TempSrc:   PainSc: 0-No pain   Pain Goal:                   [name]

## 2023-10-11 MED ORDER — IBUPROFEN 600 MG PO TABS: 600.0000 mg | ORAL_TABLET | Freq: Four times a day (QID) | ORAL | 1 refills | Status: AC | PRN

## 2023-10-11 MED ORDER — DOCUSATE SODIUM 100 MG PO CAPS: 100.0000 mg | ORAL_CAPSULE | Freq: Two times a day (BID) | ORAL | 0 refills | Status: AC

## 2023-10-11 NOTE — Progress Notes (Signed)
 Post Partum Day 1 Subjective: no complaints, up ad lib, voiding, tolerating PO, + flatus, and lochia mild. No BM yet but feels urge. She reports no CP, SOB or HA. Desires circumcision for baby. She would like discharge to home today  Objective: Blood pressure 114/72, pulse 67, temperature 97.7 F (36.5 C), temperature source Oral, resp. rate 18, height 5' 5 (1.651 m), weight 89.8 kg, SpO2 99%, unknown if currently breastfeeding.  Physical Exam:  General: alert, cooperative, and no distress Lochia: appropriate Uterine Fundus: firm Incision: n/a DVT Evaluation: No evidence of DVT seen on physical exam. No significant calf/ankle edema.  Recent Labs    10/09/23 1910 10/10/23 0713  HGB 11.3* 10.5*  HCT 34.4* 31.5*    Assessment/Plan: Discharge home, Breastfeeding, and Circumcision prior to discharge Instructions reviewed  Plan BP check in office in 3 days Pt understands that neonatal circumcision is not considered medically necessary and is elective. The risks include, but are not limited to bleeding, infection, damage to the penis, development of scar tissue, and having to have it redone at a later date. Pt understands theses risks and wishes to proceed   LOS: 2 days   Holly Carroll W Fate Galanti, DO 10/11/2023, 10:39 AM

## 2023-10-11 NOTE — Discharge Instructions (Signed)
 Call office with any concerns [phone]

## 2023-10-11 NOTE — Discharge Summary (Signed)
 Postpartum Discharge Summary  Date of Service updated      Patient Name: Holly Jacobs DOB: 1996/01/12 MRN: [id number]  Date of admission: 10/09/2023 Delivery date:10/10/2023 Delivering provider: SUDIE LAVONIA HERO Date of discharge: 10/11/2023  Admitting diagnosis: Gestational (pregnancy-induced) hypertension without significant proteinuria, complicating childbirth [O13.4] Intrauterine pregnancy: [redacted]w[redacted]d     Secondary diagnosis:  Principal Problem:   Gestational (pregnancy-induced) hypertension without significant proteinuria, complicating childbirth  Additional problems: none    Discharge diagnosis: Term Pregnancy Delivered and Gestational Hypertension                                              Post partum procedures:rhogam Augmentation: AROM and Pitocin  Complications: None  Hospital course: Induction of Labor With Vaginal Delivery   28 y.o. yo [id number] at [redacted]w[redacted]d was admitted to the hospital 10/09/2023 for induction of labor.  Indication for induction: Favorable cervix at term and Gestational hypertension.  Patient had an labor course complicated by n/a Membrane Rupture Time/Date:  ,   Delivery Method:Vaginal, Spontaneous Operative Delivery:N/A Episiotomy: None Lacerations:  None Details of delivery can be found in separate delivery note.  Patient had a postpartum course complicated by . Patient is discharged home 10/11/23.  Newborn Data: Birth date:10/10/2023 Birth time:4:38 AM Gender:Female Living status:Living Apgars:7 ,9  Weight:3430 g  Magnesium  Sulfate received: No BMZ received: No Rhophylac :Yes MMR:N/A T-DaP:Given prenatally Flu: N/A RSV Vaccine received: No Transfusion:No Immunizations administered: Immunization History  Administered Date(s) Administered   Influenza Inj Mdck Quad Pf 02/07/2020   Influenza,inj,Quad PF,6+ Mos 11/24/2018   Tdap 03/28/2019, 07/09/2020, 08/12/2023    Physical exam  Vitals:   10/10/23 1233 10/10/23 1556 10/10/23 1959  10/11/23 0455  BP:  123/83 124/77 114/72  Pulse:  68 68 67  Resp:   18 18  Temp: 97.7 F (36.5 C) 97.9 F (36.6 C) 97.6 F (36.4 C) 97.7 F (36.5 C)  TempSrc:  Oral Oral Oral  SpO2:      Weight:      Height:       General: alert, cooperative, and no distress Lochia: appropriate Uterine Fundus: firm Incision: N/A DVT Evaluation: No evidence of DVT seen on physical exam. No significant calf/ankle edema. Labs: Lab Results  Component Value Date   WBC 17.0 (H) 10/10/2023   HGB 10.5 (L) 10/10/2023   HCT 31.5 (L) 10/10/2023   MCV 85.1 10/10/2023   PLT 254 10/10/2023      Latest Ref Rng & Units 10/09/2023    3:47 PM  CMP  Glucose 70 - 99 mg/dL 857   BUN 6 - 20 mg/dL 7   Creatinine 9.55 - 8.99 mg/dL 9.28   Sodium 864 - 854 mmol/L 136   Potassium 3.5 - 5.1 mmol/L 3.6   Chloride 98 - 111 mmol/L 106   CO2 22 - 32 mmol/L 22   Calcium  8.9 - 10.3 mg/dL 8.7   Total Protein 6.5 - 8.1 g/dL 5.9   Total Bilirubin 0.0 - 1.2 mg/dL 0.4   Alkaline Phos 38 - 126 U/L 106   AST 15 - 41 U/L 18   ALT 0 - 44 U/L 12    Edinburgh Score:    10/10/2023    9:09 PM  Edinburgh Postnatal Depression Scale Screening Tool  I have been able to laugh and see the funny side of things. 1  I have  looked forward with enjoyment to things. 1  I have blamed myself unnecessarily when things went wrong. 0  I have been anxious or worried for no good reason. 3  I have felt scared or panicky for no good reason. 3  Things have been getting on top of me. 2  I have been so unhappy that I have had difficulty sleeping. 3  I have felt sad or miserable. 1  I have been so unhappy that I have been crying. 0  The thought of harming myself has occurred to me. 0  Edinburgh Postnatal Depression Scale Total 14      After visit meds:  Allergies as of 10/11/2023       Reactions   Imitrex [sumatriptan Base] Anaphylaxis, Shortness Of Breath, Itching   Sulfa Antibiotics Anaphylaxis   Latex Hives   Nitrofurantoin Nausea  Only   Tamsulosin  Hcl Itching   Tramadol Hives   Other Itching, Rash   Pt says she had a previous reaction to the stent placement 11 yrs ago and her MD is aware Pt also allergic to pre-wash done before procedure        Medication List     STOP taking these medications    aspirin  EC 81 MG tablet   metroNIDAZOLE  500 MG tablet Commonly known as: FLAGYL    ondansetron  4 MG disintegrating tablet Commonly known as: ZOFRAN -ODT       TAKE these medications    acetaminophen  500 MG tablet Commonly known as: TYLENOL  Take 500 mg by mouth every 6 (six) hours as needed.   docusate sodium  100 MG capsule Commonly known as: COLACE Take 1 capsule (100 mg total) by mouth 2 (two) times daily.   hydrOXYzine 50 MG tablet Commonly known as: ATARAX Take 50 mg by mouth 3 (three) times daily as needed for anxiety.   ibuprofen  600 MG tablet Commonly known as: ADVIL  Take 1 tablet (600 mg total) by mouth every 6 (six) hours as needed for moderate pain (pain score 4-6) or cramping.   prenatal multivitamin Tabs tablet Take 1 tablet by mouth daily at 12 noon.         Discharge home in stable condition Infant Feeding: Breast Infant Disposition:home with mother Discharge instruction: per After Visit Summary and Postpartum booklet. Activity: Advance as tolerated. Pelvic rest for 6 weeks.  Diet: low salt diet Anticipated Birth Control: Unsure Postpartum Appointment:6 weeks Additional Postpartum F/U: BP check 2-3 days Future Appointments:No future appointments. Follow up Visit:  Follow-up Information     [facility] Schedule an appointment as soon as possible for a visit.   Why: 3 days for BP check and 6 weeks for postpartum visit Contact information: [address]  [address] [place] [address] [phone]                     10/11/2023 Ted LELON Solo, DO
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
## 2023-10-20 ENCOUNTER — Telehealth (HOSPITAL_COMMUNITY): Payer: Self-pay | Admitting: *Deleted

## 2023-10-20 NOTE — Telephone Encounter (Signed)
 10/20/2023  Name: Holly Jacobs MRN: [id number] DOB: 01/14/1996  Reason for Call:  Transition of Care Hospital Discharge Call  Contact Status: Patient Contact Status: Complete  Language assistant needed: Interpreter Mode: Interpreter Not Needed        Follow-Up Questions: Do You Have Any Concerns About Your Health As You Heal From Delivery?: No Do You Have Any Concerns About Your Infants Health?: No  [place] Postnatal Depression Scale:  In the Past 7 Days: I have been able to laugh and see the funny side of things.: As much as I always could I have looked forward with enjoyment to things.: Rather less than I used to I have blamed myself unnecessarily when things went wrong.: No, never I have been anxious or worried for no good reason.: Yes, very often I have felt scared or panicky for no good reason.: Yes, quite a lot Things have been getting on top of me.: No, most of the time I have coped quite well I have been so unhappy that I have had difficulty sleeping.: Yes, most of the time I have felt sad or miserable.: Not very often I have been so unhappy that I have been crying.: No, never The thought of harming myself has occurred to me.: Never [place] Postnatal Depression Scale Total: (!) 12  PHQ2-9 Depression Scale:     Discharge Follow-up: [place] score requires follow up?: Yes Provider notified of [place] score?: No Have you already been referred for a counseling appointment?: No Patient was advised of the following resources:: Support Group, Breastfeeding Support Group (declines postpartum group information via email)  Post-discharge interventions: Reviewed Newborn Safe Sleep Practices Maternal Mental Health Resources provided  Mliss Sieve, RN 10/20/2023 11:24

## 2023-10-27 ENCOUNTER — Inpatient Hospital Stay (HOSPITAL_COMMUNITY): Admit: 2023-10-27

## 2023-11-12 ENCOUNTER — Encounter (HOSPITAL_COMMUNITY)
